# Patient Record
Sex: Male | Born: 1990 | Race: White | Hispanic: No | Marital: Single | Smoking: Never smoker
Health system: Southern US, Community
[De-identification: ages and names within clinical notes are randomized; demographics above are authoritative.]

## PROBLEM LIST (undated history)

## (undated) DIAGNOSIS — S86812A Strain of other muscle(s) and tendon(s) at lower leg level, left leg, initial encounter: Secondary | ICD-10-CM

## (undated) DIAGNOSIS — Z789 Other specified health status: Secondary | ICD-10-CM

## (undated) HISTORY — PX: NO PAST SURGERIES: SHX2092

---
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
## 2011-01-04 ENCOUNTER — Inpatient Hospital Stay (INDEPENDENT_AMBULATORY_CARE_PROVIDER_SITE_OTHER)
Admission: RE | Admit: 2011-01-04 | Discharge: 2011-01-04 | Disposition: A | Discharge status: Home / Self Care | Payer: BC Managed Care – PPO | Source: Ambulatory Visit | Attending: Emergency Medicine | Admitting: Emergency Medicine

## 2011-01-04 DIAGNOSIS — Z888 Allergy status to other drugs, medicaments and biological substances status: Secondary | ICD-10-CM

## 2011-01-05 ENCOUNTER — Emergency Department (HOSPITAL_BASED_OUTPATIENT_CLINIC_OR_DEPARTMENT_OTHER)
Admission: EM | Admit: 2011-01-05 | Discharge: 2011-01-05 | Disposition: A | Discharge status: Home / Self Care | Payer: BC Managed Care – PPO | Attending: Emergency Medicine | Admitting: Emergency Medicine

## 2011-01-05 DIAGNOSIS — R21 Rash and other nonspecific skin eruption: Secondary | ICD-10-CM | POA: Insufficient documentation
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
## 2011-04-16 LAB — CBC
Hemoglobin: 14
MCV: 88.6
RDW: 11.9
WBC: 11.2

## 2011-04-16 LAB — URINALYSIS, ROUTINE W REFLEX MICROSCOPIC
Bilirubin Urine: NEGATIVE
Glucose, UA: NEGATIVE
Ketones, ur: NEGATIVE
Nitrite: NEGATIVE
Protein, ur: NEGATIVE
Urobilinogen, UA: 0.2

## 2011-04-16 LAB — DIFFERENTIAL
Basophils Absolute: 0.1
Basophils Relative: 1
Eosinophils Absolute: 0.2
Lymphocytes Relative: 26
Monocytes Absolute: 1.2
Monocytes Relative: 11

## 2011-04-16 LAB — COMPREHENSIVE METABOLIC PANEL
ALT: 35
Albumin: 4.9
Chloride: 100
Creatinine, Ser: 1.1
Sodium: 142
Total Bilirubin: 0.3

## 2011-09-27 ENCOUNTER — Emergency Department (INDEPENDENT_AMBULATORY_CARE_PROVIDER_SITE_OTHER): Payer: BC Managed Care – PPO

## 2011-09-27 ENCOUNTER — Emergency Department (INDEPENDENT_AMBULATORY_CARE_PROVIDER_SITE_OTHER)
Admission: EM | Admit: 2011-09-27 | Discharge: 2011-09-27 | Disposition: A | Discharge status: Home Health | Payer: BC Managed Care – PPO | Source: Home / Self Care

## 2011-09-27 ENCOUNTER — Encounter (HOSPITAL_COMMUNITY): Payer: Self-pay | Admitting: Emergency Medicine

## 2011-09-27 DIAGNOSIS — S52599A Other fractures of lower end of unspecified radius, initial encounter for closed fracture: Secondary | ICD-10-CM

## 2011-09-27 DIAGNOSIS — W19XXXA Unspecified fall, initial encounter: Secondary | ICD-10-CM

## 2011-09-27 DIAGNOSIS — S62009A Unspecified fracture of navicular [scaphoid] bone of unspecified wrist, initial encounter for closed fracture: Secondary | ICD-10-CM

## 2011-09-27 DIAGNOSIS — S52515A Nondisplaced fracture of left radial styloid process, initial encounter for closed fracture: Secondary | ICD-10-CM

## 2011-09-27 MED ORDER — IBUPROFEN 800 MG PO TABS
800.0000 mg | ORAL_TABLET | Freq: Once | ORAL | Status: AC
  Administered 2011-09-27: 800 mg via ORAL

## 2011-09-27 MED ORDER — HYDROCODONE-ACETAMINOPHEN 5-325 MG PO TABS: 1.0000 | ORAL_TABLET | Freq: Four times a day (QID) | ORAL | Status: AC | PRN

## 2011-09-27 MED ORDER — IBUPROFEN 800 MG PO TABS
ORAL_TABLET | ORAL | Status: AC
  Filled 2011-09-27: qty 1

## 2011-09-27 NOTE — Discharge Instructions (Signed)
Elevate your wrist as much as possible, and apply ice packs 3-4 times for 15-20 minutes. Wear splint until you see Dr Lestine Box for follow up.  Do not drive or operate machinery while taking Hydrocodone for pain.

## 2011-09-27 NOTE — ED Notes (Signed)
PT HERE WITH POSS LEFT WRIST FX AFTER PLAYING BASKETBALL YESTERDAY AND FELL ON  WRIST AFTER TRYING TO BLOCK PASS.DECREASE ROM AND SWELLING SEEN BUT DENIES NUMB/TINGLING.

## 2011-09-27 NOTE — ED Provider Notes (Signed)
History     CSN: [id number]  Arrival date & time 09/27/11  0841   None     Chief Complaint  Patient presents with  . Wrist Pain    (Consider location/radiation/quality/duration/timing/severity/associated sxs/prior treatment) HPI Comments: Patient presents today with complaints of left wrist pain. He fell yesterday while playing basketball landing on an outstretched left hand. He points to the distal radial head and scaphoid area as his area of tenderness. He denies any numbness or tingling.   History reviewed. No pertinent past medical history.  History reviewed. No pertinent past surgical history.  History reviewed. No pertinent family history.  History  Substance Use Topics  . Smoking status: Never Smoker   . Smokeless tobacco: Not on file  . Alcohol Use: No      Review of Systems  Musculoskeletal: Negative for joint swelling.  Skin: Negative for color change and wound.  Neurological: Negative for numbness.    Allergies  Review of patient's allergies indicates no known allergies.  Home Medications   Current Outpatient Rx  Name Route Sig Dispense Refill  . HYDROCODONE-ACETAMINOPHEN 5-325 MG PO TABS Oral Take 1 tablet by mouth every 6 (six) hours as needed for pain. 10 tablet 0    BP 131/79  Pulse 88  Temp(Src) 97.2 F (36.2 C) (Oral)  Resp 16  SpO2 99%  Physical Exam  Nursing note and vitals reviewed. Constitutional: He appears well-developed and well-nourished. No distress.  Cardiovascular:  Pulses:      Radial pulses are 2+ on the left side.       Cap refill < 2 sec Lt hand  Musculoskeletal:       Left wrist: He exhibits bony tenderness (distal radial head and scaphoid area) and swelling (mild). He exhibits normal range of motion, no deformity and no laceration.       Left hand: Normal. normal sensation noted. Normal strength noted.  Neurological: He is alert. He has normal strength. No sensory deficit.  Skin: Skin is warm and dry. No abrasion and  no bruising noted.  Psychiatric: He has a normal mood and affect.    ED Course  Procedures (including critical care time)  Labs Reviewed - No data to display Dg Wrist Complete Left  09/27/2011  *RADIOLOGY REPORT*  Clinical Data: Left wrist pain after fall.  LEFT WRIST - COMPLETE 3+ VIEW  Comparison: None.  Findings: Four views study of the left wrist shows a linear lucency through the articular surface of the distal radius.  This raises the question of a nondisplaced radial styloid fracture.  On the scaphoid view, there is a linear lucency through the proximal scaphoid.  Although this is slightly more proximal than typically seen for a scaphoid waist fracture, a nondisplaced scaphoid fracture is a consideration.  IMPRESSION: Two findings in the left wrist which raise concern for nondisplaced fractures.  Either CT without contrast or MRI without contrast could be used to more fully characterize these findings.  Original Report Authenticated By: ERIC A. MANSELL, M.D.     1. Scaphoid fracture of wrist   2. Nondisplaced fracture of styloid process of left radius       MDM  Xray reviewed by myself and radiologist. Discussed importance of Ortho f/u for scaphoid fracture with pt, and risk of poor healing scaphoid fractures.         Melody Comas, [place] 09/27/11 1148

## 2011-09-29 NOTE — ED Notes (Signed)
Pt. came to [facility] because he lost his work note. Note redone like original and given to pt. Pt. states he called Dr. Lestine Box office and they gave him another doctors office to follow-up but can't remember his name.  He has an appt. next Thur. 10/07/11.  I called Dr. Lestine Box office and they said Dr. Mina Marble was on call for hands on 3/11 and that is why they gave him that office. I asked Dr. Juanetta Gosling and he said it was OK for pt. to wait the 10 days for recheck of a scaphoid fx. and can work with splint doing light duty. Office notified he would stay with appt. With Dr. Mina Marble. Pt. Notified also and given Dr. Ronie Spies address and phone number. Vassie Moselle 09/29/2011

## 2015-04-10 ENCOUNTER — Encounter (HOSPITAL_COMMUNITY): Payer: Self-pay | Admitting: Emergency Medicine

## 2015-04-10 ENCOUNTER — Emergency Department (INDEPENDENT_AMBULATORY_CARE_PROVIDER_SITE_OTHER)
Admission: EM | Admit: 2015-04-10 | Discharge: 2015-04-10 | Disposition: A | Discharge status: Home / Self Care | Payer: BLUE CROSS/BLUE SHIELD | Source: Home / Self Care | Attending: Family Medicine | Admitting: Family Medicine

## 2015-04-10 DIAGNOSIS — Z041 Encounter for examination and observation following transport accident: Secondary | ICD-10-CM

## 2015-04-10 DIAGNOSIS — S161XXA Strain of muscle, fascia and tendon at neck level, initial encounter: Secondary | ICD-10-CM

## 2015-04-10 DIAGNOSIS — Z043 Encounter for examination and observation following other accident: Secondary | ICD-10-CM | POA: Diagnosis not present

## 2015-04-10 NOTE — Discharge Instructions (Signed)
Cervical Strain and Sprain (Whiplash) °with Rehab °Cervical strain and sprain are injuries that commonly occur with "whiplash" injuries. Whiplash occurs when the neck is forcefully whipped backward or forward, such as during a motor vehicle accident or during contact sports. The muscles, ligaments, tendons, discs, and nerves of the neck are susceptible to injury when this occurs. °RISK FACTORS °Risk of having a whiplash injury increases if: °· Osteoarthritis of the spine. °· Situations that make head or neck accidents or trauma more likely. °· High-risk sports (football, rugby, wrestling, hockey, auto racing, gymnastics, diving, contact karate, or boxing). °· Poor strength and flexibility of the neck. °· Previous neck injury. °· Poor tackling technique. °· Improperly fitted or padded equipment. °SYMPTOMS  °· Pain or stiffness in the front or back of neck or both. °· Symptoms may present immediately or up to 24 hours after injury. °· Dizziness, headache, nausea, and vomiting. °· Muscle spasm with soreness and stiffness in the neck. °· Tenderness and swelling at the injury site. °PREVENTION °· Learn and use proper technique (avoid tackling with the head, spearing, and head-butting; use proper falling techniques to avoid landing on the head). °· Warm up and stretch properly before activity. °· Maintain physical fitness: °· Strength, flexibility, and endurance. °· Cardiovascular fitness. °· Wear properly fitted and padded protective equipment, such as padded soft collars, for participation in contact sports. °PROGNOSIS  °Recovery from cervical strain and sprain injuries is dependent on the extent of the injury. These injuries are usually curable in 1 week to 3 months with appropriate treatment.  °RELATED COMPLICATIONS  °· Temporary numbness and weakness may occur if the nerve roots are damaged, and this may persist until the nerve has completely healed. °· Chronic pain due to frequent recurrence of  symptoms. °· Prolonged healing, especially if activity is resumed too soon (before complete recovery). °TREATMENT  °Treatment initially involves the use of ice and medication to help reduce pain and inflammation. It is also important to perform strengthening and stretching exercises and modify activities that worsen symptoms so the injury does not get worse. These exercises may be performed at home or with a therapist. For patients who experience severe symptoms, a soft, padded collar may be recommended to be worn around the neck.  °Improving your posture may help reduce symptoms. Posture improvement includes pulling your chin and abdomen in while sitting or standing. If you are sitting, sit in a firm chair with your buttocks against the back of the chair. While sleeping, try replacing your pillow with a small towel rolled to 2 inches in diameter, or use a cervical pillow or soft cervical collar. Poor sleeping positions delay healing.  °For patients with nerve root damage, which causes numbness or weakness, the use of a cervical traction apparatus may be recommended. Surgery is rarely necessary for these injuries. However, cervical strain and sprains that are present at birth (congenital) may require surgery. °MEDICATION  °· If pain medication is necessary, nonsteroidal anti-inflammatory medications, such as aspirin and ibuprofen, or other minor pain relievers, such as acetaminophen, are often recommended. °· Do not take pain medication for 7 days before surgery. °· Prescription pain relievers may be given if deemed necessary by your caregiver. Use only as directed and only as much as you need. °HEAT AND COLD:  °· Cold treatment (icing) relieves pain and reduces inflammation. Cold treatment should be applied for 10 to 15 minutes every 2 to 3 hours for inflammation and pain and immediately after any activity that aggravates   your symptoms. Use ice packs or an ice massage. °· Heat treatment may be used prior to  performing the stretching and strengthening activities prescribed by your caregiver, physical therapist, or athletic trainer. Use a heat pack or a warm soak. °SEEK MEDICAL CARE IF:  °· Symptoms get worse or do not improve in 2 weeks despite treatment. °· New, unexplained symptoms develop (drugs used in treatment may produce side effects). °EXERCISES °RANGE OF MOTION (ROM) AND STRETCHING EXERCISES - Cervical Strain and Sprain °These exercises may help you when beginning to rehabilitate your injury. In order to successfully resolve your symptoms, you must improve your posture. These exercises are designed to help reduce the forward-head and rounded-shoulder posture which contributes to this condition. Your symptoms may resolve with or without further involvement from your physician, physical therapist or athletic trainer. While completing these exercises, remember:  °· Restoring tissue flexibility helps normal motion to return to the joints. This allows healthier, less painful movement and activity. °· An effective stretch should be held for at least 20 seconds, although you may need to begin with shorter hold times for comfort. °· A stretch should never be painful. You should only feel a gentle lengthening or release in the stretched tissue. °STRETCH- Axial Extensors °· Lie on your back on the floor. You may bend your knees for comfort. Place a rolled-up hand towel or dish towel, about 2 inches in diameter, under the part of your head that makes contact with the floor. °· Gently tuck your chin, as if trying to make a "double chin," until you feel a gentle stretch at the base of your head. °· Hold __________ seconds. °Repeat __________ times. Complete this exercise __________ times per day.  °STRETCH - Axial Extension  °· Stand or sit on a firm surface. Assume a good posture: chest up, shoulders drawn back, abdominal muscles slightly tense, knees unlocked (if standing) and feet hip width apart. °· Slowly retract your  chin so your head slides back and your chin slightly lowers. Continue to look straight ahead. °· You should feel a gentle stretch in the back of your head. Be certain not to feel an aggressive stretch since this can cause headaches later. °· Hold for __________ seconds. °Repeat __________ times. Complete this exercise __________ times per day. °STRETCH - Cervical Side Bend  °· Stand or sit on a firm surface. Assume a good posture: chest up, shoulders drawn back, abdominal muscles slightly tense, knees unlocked (if standing) and feet hip width apart. °· Without letting your nose or shoulders move, slowly tip your right / left ear to your shoulder until your feel a gentle stretch in the muscles on the opposite side of your neck. °· Hold __________ seconds. °Repeat __________ times. Complete this exercise __________ times per day. °STRETCH - Cervical Rotators  °· Stand or sit on a firm surface. Assume a good posture: chest up, shoulders drawn back, abdominal muscles slightly tense, knees unlocked (if standing) and feet hip width apart. °· Keeping your eyes level with the ground, slowly turn your head until you feel a gentle stretch along the back and opposite side of your neck. °· Hold __________ seconds. °Repeat __________ times. Complete this exercise __________ times per day. °RANGE OF MOTION - Neck Circles  °· Stand or sit on a firm surface. Assume a good posture: chest up, shoulders drawn back, abdominal muscles slightly tense, knees unlocked (if standing) and feet hip width apart. °· Gently roll your head down and around from the   back of one shoulder to the back of the other. The motion should never be forced or painful.  Repeat the motion 10-20 times, or until you feel the neck muscles relax and loosen. Repeat __________ times. Complete the exercise __________ times per day. STRENGTHENING EXERCISES - Cervical Strain and Sprain These exercises may help you when beginning to rehabilitate your injury. They may  resolve your symptoms with or without further involvement from your physician, physical therapist, or athletic trainer. While completing these exercises, remember:   Muscles can gain both the endurance and the strength needed for everyday activities through controlled exercises.  Complete these exercises as instructed by your physician, physical therapist, or athletic trainer. Progress the resistance and repetitions only as guided.  You may experience muscle soreness or fatigue, but the pain or discomfort you are trying to eliminate should never worsen during these exercises. If this pain does worsen, stop and make certain you are following the directions exactly. If the pain is still present after adjustments, discontinue the exercise until you can discuss the trouble with your clinician. STRENGTH - Cervical Flexors, Isometric  Face a wall, standing about 6 inches away. Place a small pillow, a ball about 6-8 inches in diameter, or a folded towel between your forehead and the wall.  Slightly tuck your chin and gently push your forehead into the soft object. Push only with mild to moderate intensity, building up tension gradually. Keep your jaw and forehead relaxed.  Hold 10 to 20 seconds. Keep your breathing relaxed.  Release the tension slowly. Relax your neck muscles completely before you start the next repetition. Repeat __________ times. Complete this exercise __________ times per day. STRENGTH- Cervical Lateral Flexors, Isometric   Stand about 6 inches away from a wall. Place a small pillow, a ball about 6-8 inches in diameter, or a folded towel between the side of your head and the wall.  Slightly tuck your chin and gently tilt your head into the soft object. Push only with mild to moderate intensity, building up tension gradually. Keep your jaw and forehead relaxed.  Hold 10 to 20 seconds. Keep your breathing relaxed.  Release the tension slowly. Relax your neck muscles completely  before you start the next repetition. Repeat __________ times. Complete this exercise __________ times per day. STRENGTH - Cervical Extensors, Isometric   Stand about 6 inches away from a wall. Place a small pillow, a ball about 6-8 inches in diameter, or a folded towel between the back of your head and the wall.  Slightly tuck your chin and gently tilt your head back into the soft object. Push only with mild to moderate intensity, building up tension gradually. Keep your jaw and forehead relaxed.  Hold 10 to 20 seconds. Keep your breathing relaxed.  Release the tension slowly. Relax your neck muscles completely before you start the next repetition. Repeat __________ times. Complete this exercise __________ times per day. POSTURE AND BODY MECHANICS CONSIDERATIONS - Cervical Strain and Sprain Keeping correct posture when sitting, standing or completing your activities will reduce the stress put on different body tissues, allowing injured tissues a chance to heal and limiting painful experiences. The following are general guidelines for improved posture. Your physician or physical therapist will provide you with any instructions specific to your needs. While reading these guidelines, remember:  The exercises prescribed by your provider will help you have the flexibility and strength to maintain correct postures.  The correct posture provides the optimal environment for your joints to  work. All of your joints have less wear and tear when properly supported by a spine with good posture. This means you will experience a healthier, less painful body.  Correct posture must be practiced with all of your activities, especially prolonged sitting and standing. Correct posture is as important when doing repetitive low-stress activities (typing) as it is when doing a single heavy-load activity (lifting). PROLONGED STANDING WHILE SLIGHTLY LEANING FORWARD When completing a task that requires you to lean  forward while standing in one place for a long time, place either foot up on a stationary 2- to 4-inch high object to help maintain the best posture. When both feet are on the ground, the low back tends to lose its slight inward curve. If this curve flattens (or becomes too large), then the back and your other joints will experience too much stress, fatigue more quickly, and can cause pain.  RESTING POSITIONS Consider which positions are most painful for you when choosing a resting position. If you have pain with flexion-based activities (sitting, bending, stooping, squatting), choose a position that allows you to rest in a less flexed posture. You would want to avoid curling into a fetal position on your side. If your pain worsens with extension-based activities (prolonged standing, working overhead), avoid resting in an extended position such as sleeping on your stomach. Most people will find more comfort when they rest with their spine in a more neutral position, neither too rounded nor too arched. Lying on a non-sagging bed on your side with a pillow between your knees, or on your back with a pillow under your knees will often provide some relief. Keep in mind, being in any one position for a prolonged period of time, no matter how correct your posture, can still lead to stiffness. WALKING Walk with an upright posture. Your ears, shoulders, and hips should all line up. OFFICE WORK When working at a desk, create an environment that supports good, upright posture. Without extra support, muscles fatigue and lead to excessive strain on joints and other tissues. CHAIR:  A chair should be able to slide under your desk when your back makes contact with the back of the chair. This allows you to work closely.  The chair's height should allow your eyes to be level with the upper part of your monitor and your hands to be slightly lower than your elbows.  Body position:  Your feet should make contact with the  floor. If this is not possible, use a foot rest.  Keep your ears over your shoulders. This will reduce stress on your neck and low back. Document Released: 07/05/2005 Document Revised: 11/19/2013 Document Reviewed: 10/17/2008 [facility] Patient Information 2015 [place], [place]. This information is not intended to replace advice given to you by your health care provider. Make sure you discuss any questions you have with your health care provider.  Muscle Strain A muscle strain is an injury that occurs when a muscle is stretched beyond its normal length. Usually a small number of muscle fibers are torn when this happens. Muscle strain is rated in degrees. First-degree strains have the least amount of muscle fiber tearing and pain. Second-degree and third-degree strains have increasingly more tearing and pain.  Usually, recovery from muscle strain takes 1-2 weeks. Complete healing takes 5-6 weeks.  CAUSES  Muscle strain happens when a sudden, violent force placed on a muscle stretches it too far. This may occur with lifting, sports, or a fall.  RISK FACTORS Muscle strain is  especially common in athletes.  SIGNS AND SYMPTOMS At the site of the muscle strain, there may be:  Pain.  Bruising.  Swelling.  Difficulty using the muscle due to pain or lack of normal function. DIAGNOSIS  Your health care provider will perform a physical exam and ask about your medical history. TREATMENT  Often, the best treatment for a muscle strain is resting, icing, and applying cold compresses to the injured area.  HOME CARE INSTRUCTIONS   Use the PRICE method of treatment to promote muscle healing during the first 2-3 days after your injury. The PRICE method involves:  Protecting the muscle from being injured again.  Restricting your activity and resting the injured body part.  Icing your injury. To do this, put ice in a plastic bag. Place a towel between your skin and the bag. Then, apply the ice and leave  it on from 15-20 minutes each hour. After the third day, switch to moist heat packs.  Apply compression to the injured area with a splint or elastic bandage. Be careful not to wrap it too tightly. This may interfere with blood circulation or increase swelling.  Elevate the injured body part above the level of your heart as often as you can.  Only take over-the-counter or prescription medicines for pain, discomfort, or fever as directed by your health care provider.  Warming up prior to exercise helps to prevent future muscle strains. SEEK MEDICAL CARE IF:   You have increasing pain or swelling in the injured area.  You have numbness, tingling, or a significant loss of strength in the injured area. MAKE SURE YOU:   Understand these instructions.  Will watch your condition.  Will get help right away if you are not doing well or get worse. Document Released: 07/05/2005 Document Revised: 04/25/2013 Document Reviewed: 02/01/2013 [facility] Patient Information 2015 [place], [place]. This information is not intended to replace advice given to you by your health care provider. Make sure you discuss any questions you have with your health care provider.
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
## 2015-09-05 ENCOUNTER — Ambulatory Visit (INDEPENDENT_AMBULATORY_CARE_PROVIDER_SITE_OTHER): Payer: BLUE CROSS/BLUE SHIELD | Admitting: Family Medicine

## 2015-09-05 ENCOUNTER — Encounter: Payer: Self-pay | Admitting: Family Medicine

## 2015-09-05 VITALS — BP 116/76 | HR 64 | Ht 73.5 in | Wt 216.0 lb

## 2015-09-05 DIAGNOSIS — Z0000 Encounter for general adult medical examination without abnormal findings: Secondary | ICD-10-CM

## 2015-09-05 DIAGNOSIS — R05 Cough: Secondary | ICD-10-CM

## 2015-09-05 DIAGNOSIS — M545 Low back pain, unspecified: Secondary | ICD-10-CM

## 2015-09-05 DIAGNOSIS — R059 Cough, unspecified: Secondary | ICD-10-CM

## 2015-09-05 LAB — POCT URINALYSIS DIPSTICK
BILIRUBIN UA: NEGATIVE
Blood, UA: NEGATIVE
Glucose, UA: NEGATIVE
KETONES UA: NEGATIVE
LEUKOCYTES UA: NEGATIVE
Nitrite, UA: NEGATIVE
PH UA: 7
Protein, UA: NEGATIVE
Spec Grav, UA: 1.025
Urobilinogen, UA: NEGATIVE

## 2015-09-05 NOTE — Patient Instructions (Addendum)
As we discussed, since your cough and symptoms are continuing to improve, I recommend over-the-counter Robitussin-DM or Mucinex DM as needed and staying well hydrated. You can use cough lozenges also.  If your cough gets worse again call and we can prescribe cough medication or other therapies.  I recommend that you try to get any previous records and immunizations sent to our office.  Return to my office fasting (nothing to eat or drink for 6 hours) at your convenience for fasting blood work.   As we discussed, for your low back discomfort, use heat to the area 20 minutes at a time 2-3 times per day and try stretching. You may also take ibuprofen 800 mg or Aleve 2 tablets twice daily with food. If this gets worse, let me know.  Preventative Care for Adults, Male       REGULAR HEALTH EXAMS:  A routine yearly physical is a good way to check in with your primary care provider about your health and preventive screening. It is also an opportunity to share updates about your health and any concerns you have, and receive a thorough all-over exam.   Most health insurance companies pay for at least some preventative services.  Check with your health plan for specific coverages.  WHAT PREVENTATIVE SERVICES DO MEN NEED?  Adult men should have their weight and blood pressure checked regularly.   Men age 62 and older should have their cholesterol levels checked regularly.  Beginning at age 78 and continuing to age 64, men should be screened for colorectal cancer.  Certain people should may need continued testing until age 44.  Other cancer screening may include exams for testicular and prostate cancer.  Updating vaccinations is part of preventative care.  Vaccinations help protect against diseases such as the flu.  Lab tests are generally done as part of preventative care to screen for anemia and blood disorders, to screen for problems with the kidneys and liver, to screen for bladder problems, to  check blood sugar, and to check your cholesterol level.  Preventative services generally include counseling about diet, exercise, avoiding tobacco, drugs, excessive alcohol consumption, and sexually transmitted infections.    GENERAL RECOMMENDATIONS FOR GOOD HEALTH:  Healthy diet:  Eat a variety of foods, including fruit, vegetables, animal or vegetable protein, such as meat, fish, chicken, and eggs, or beans, lentils, tofu, and grains, such as rice.  Drink plenty of water daily.  Decrease saturated fat in the diet, avoid lots of red meat, processed foods, sweets, fast foods, and fried foods.  Exercise:  Aerobic exercise helps maintain good heart health. At least 30-40 minutes of moderate-intensity exercise is recommended. For example, a brisk walk that increases your heart rate and breathing. This should be done on most days of the week.   Find a type of exercise or a variety of exercises that you enjoy so that it becomes a part of your daily life.  Examples are running, walking, swimming, water aerobics, and biking.  For motivation and support, explore group exercise such as aerobic class, spin class, Zumba, Yoga,or  martial arts, etc.    Set exercise goals for yourself, such as a certain weight goal, walk or run in a race such as a 5k walk/run.  Speak to your primary care provider about exercise goals.  Disease prevention:  If you smoke or chew tobacco, find out from your caregiver how to quit. It can literally save your life, no matter how long you have been a tobacco  user. If you do not use tobacco, never begin.   Maintain a healthy diet and normal weight. Increased weight leads to problems with blood pressure and diabetes.   The Body Mass Index or BMI is a way of measuring how much of your body is fat. Having a BMI above 27 increases the risk of heart disease, diabetes, hypertension, stroke and other problems related to obesity. Your caregiver can help determine your BMI and based  on it develop an exercise and dietary program to help you achieve or maintain this important measurement at a healthful level.  High blood pressure causes heart and blood vessel problems.  Persistent high blood pressure should be treated with medicine if weight loss and exercise do not work.   Fat and cholesterol leaves deposits in your arteries that can block them. This causes heart disease and vessel disease elsewhere in your body.  If your cholesterol is found to be high, or if you have heart disease or certain other medical conditions, then you may need to have your cholesterol monitored frequently and be treated with medication.   Ask if you should have a stress test if your history suggests this. A stress test is a test done on a treadmill that looks for heart disease. This test can find disease prior to there being a problem.  Avoid drinking alcohol in excess (more than two drinks per day).  Avoid use of street drugs. Do not share needles with anyone. Ask for professional help if you need assistance or instructions on stopping the use of alcohol, cigarettes, and/or drugs.  Brush your teeth twice a day with fluoride toothpaste, and floss once a day. Good oral hygiene prevents tooth decay and gum disease. The problems can be painful, unattractive, and can cause other health problems. Visit your dentist for a routine oral and dental check up and preventive care every 6-12 months.   Look at your skin regularly.  Use a mirror to look at your back. Notify your caregivers of changes in moles, especially if there are changes in shapes, colors, a size larger than a pencil eraser, an irregular border, or development of new moles.  Safety:  Use seatbelts 100% of the time, whether driving or as a passenger.  Use safety devices such as hearing protection if you work in environments with loud noise or significant background noise.  Use safety glasses when doing any work that could send debris in to the  eyes.  Use a helmet if you ride a bike or motorcycle.  Use appropriate safety gear for contact sports.  Talk to your caregiver about gun safety.  Use sunscreen with a SPF (or skin protection factor) of 15 or greater.  Lighter skinned people are at a greater risk of skin cancer. Don't forget to also wear sunglasses in order to protect your eyes from too much damaging sunlight. Damaging sunlight can accelerate cataract formation.   Practice safe sex. Use condoms. Condoms are used for birth control and to help reduce the spread of sexually transmitted infections (or STIs).  Some of the STIs are gonorrhea (the clap), chlamydia, syphilis, trichomonas, herpes, HPV (human papilloma virus) and HIV (human immunodeficiency virus) which causes AIDS. The herpes, HIV and HPV are viral illnesses that have no cure. These can result in disability, cancer and death.   Keep carbon monoxide and smoke detectors in your home functioning at all times. Change the batteries every 6 months or use a model that plugs into the wall.  Vaccinations:  Stay up to date with your tetanus shots and other required immunizations. You should have a booster for tetanus every 10 years. Be sure to get your flu shot every year, since 5%-20% of the [place] population comes down with the flu. The flu vaccine changes each year, so being vaccinated once is not enough. Get your shot in the fall, before the flu season peaks.   Other vaccines to consider:  Pneumococcal vaccine to protect against certain types of pneumonia.  This is normally recommended for adults age 61 or older.  However, adults younger than 25 years old with certain underlying conditions such as diabetes, heart or lung disease should also receive the vaccine.  Shingles vaccine to protect against Varicella Zoster if you are older than age 49, or younger than 25 years old with certain underlying illness.  Hepatitis A vaccine to protect against a form of infection of the liver by a  virus acquired from food.  Hepatitis B vaccine to protect against a form of infection of the liver by a virus acquired from blood or body fluids, particularly if you work in health care.  If you plan to travel internationally, check with your local health department for specific vaccination recommendations.  Cancer Screening:  Most routine colon cancer screening begins at the age of 29. On a yearly basis, doctors may provide special easy to use take-home tests to check for hidden blood in the stool. Sigmoidoscopy or colonoscopy can detect the earliest forms of colon cancer and is life saving. These tests use a small camera at the end of a tube to directly examine the colon. Speak to your caregiver about this at age 67, when routine screening begins (and is repeated every 5 years unless early forms of pre-cancerous polyps or small growths are found).   At the age of 38 men usually start screening for prostate cancer every year. Screening may begin at a younger age for those with higher risk. Those at higher risk include African-Americans or having a family history of prostate cancer. There are two types of tests for prostate cancer:   Prostate-specific antigen (PSA) testing. Recent studies raise questions about prostate cancer using PSA and you should discuss this with your caregiver.   Digital rectal exam (in which your doctor's lubricated and gloved finger feels for enlargement of the prostate through the anus).   Screening for testicular cancer.  Do a monthly exam of your testicles. Gently roll each testicle between your thumb and fingers, feeling for any abnormal lumps. The best time to do this is after a hot shower or bath when the tissues are looser. Notify your caregivers of any lumps, tenderness or changes in size or shape immediately.

## 2015-09-05 NOTE — Progress Notes (Signed)
Subjective:    Patient ID: William Wall, male    DOB: 1990-09-17, 25 y.o.   MRN: [id number]  HPI Chief Complaint  Patient presents with  . new    new pt cpe. cough for 2 weeks. no other concerns   He is new to the practice and here to establish primary care. He is also here for complete physical exam. He also has an acute complaint of cough for past 2 weeks. Last week he states he had fever, chills, bodyaches but that these symptoms have resolved. States cough is lingering. Reports having had bronchitis a few years ago. Denies history of pneumonia, asthma or lung disease. Has not taken anything for cough. Denies sore throat, earache, congestion, drainage.  He states he has not been able to go to the gym to do his usual workouts due to feeling bad. He has noticed some mild right low back pain that feels like a dull ache and is made worse with movement after sitting for a while or lying in bed. Denies fever, chills, urinary symptoms, weakness, numbness or tingling.    Past medical history: denies Surgeries: denies Family history: MGM with Diabetes.   Denies smoking, drug use, drinks alcohol socially.  Has a girlfriend. 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 Moved here from [place] in 2007.   Last eye exam: unknown.  Dentist: overdue.  Flu shot Tdap: need immunization records.   Wears seatbelt, wears sunscreen.   Review of Systems Review of Systems Constitutional: -fever, -chills, -sweats, -unexpected weight change,-fatigue ENT: -runny nose, -ear pain, -sore throat Cardiology:  -chest pain, -palpitations, -edema Respiratory: -cough, -shortness of breath, -wheezing Gastroenterology: -abdominal pain, -nausea, -vomiting, -diarrhea, -constipation  Hematology: -bleeding or bruising problems Musculoskeletal: -arthralgias, -myalgias, -joint swelling, -back pain Ophthalmology: -vision changes Urology: -dysuria, -difficulty urinating,  -hematuria, -urinary frequency, -urgency Neurology: -headache, -weakness, -tingling, -numbness       Objective:   Physical Exam BP 116/76 mmHg  Pulse 64  Ht 6' 1.5" (1.867 m)  Wt 216 lb (97.977 kg)  BMI 28.11 kg/m2  General Appearance:    Alert, cooperative, no distress, appears stated age  Head:    Normocephalic, without obvious abnormality, atraumatic  Eyes:    PERRL, conjunctiva/corneas clear, EOM's intact, fundi    benign  Ears:    Normal TM's and external ear canals  Nose:   Nares normal, mucosa normal, no drainage or sinus   tenderness  Throat:   Lips, mucosa, and tongue normal; teeth and gums normal  Neck:   Supple, no lymphadenopathy;  thyroid:  no   enlargement/tenderness/nodules; no carotid   bruit or JVD  Back:    Spine nontender, no curvature, ROM normal, no CVA     Tenderness, no rash or asymmetry, no spasm.  Lungs:     Clear to auscultation bilaterally without wheezes, rales or     ronchi; respirations unlabored  Chest Wall:    No tenderness or deformity   Heart:    Regular rate and rhythm, S1 and S2 normal, no murmur, rub   or gallop  Breast Exam:    No chest wall tenderness, masses or gynecomastia  Abdomen:     Soft, non-tender, nondistended, normoactive bowel sounds,    no masses, no hepatosplenomegaly  Genitalia:    Discussed that this is recommended, patient refused.  Rectal:   Deferred due to age <40 and lack of symptoms  Extremities:   No clubbing,  cyanosis or edema  Pulses:   2+ and symmetric all extremities  Skin:   Skin color, texture, turgor normal, no rashes or lesions  Lymph nodes:   Cervical, supraclavicular, and axillary nodes normal  Neurologic:   CNII-XII intact, normal strength, sensation and gait; reflexes 2+ and symmetric throughout          Psych:   Normal mood, affect, hygiene and grooming.    Urinalysis dipstick: negative      Assessment & Plan:  Routine general medical examination at a health care facility - Plan: POCT urinalysis  dipstick, CBC with Differential/Platelet, Comprehensive metabolic panel, Lipid panel  Cough  Right-sided low back pain without sciatica  Discussed that his cough is most likely related to a viral etiology and since he reports feeling improved, i recommend continued symptomatic treatment. Recommend that if he gets worse, he will let me know and we may need to treat him with antibiotic therapy at that point. Discussed using mucinex DM or Robitussin DM for cough if needed, noted that the entire time I was in the room that he did not cough.  Suspect that his low back pain is related to a musculoskeletal etiology and recommend using heat 20 minutes at a time 2-3 times daily and stretching. He may also take Ibuprofen 800 mg 3 times daily or 2 Aleve twice daily with food for pain. His urine is negative. No signs of infection or systemic illness.  Discussed safety and health promotion. Recommend that he perform self testicular exams, he refused the clinical testicular exam today. Discussed that he is in the most common age group for testicular cancer.  Recommend he follow up in 1 year for annual exam or sooner if needed.  Refused flu shot. No immunization records- he will try to get these to me.  He is not fasting today- will return for fasting labs, orders in computer.  Recommend he schedule a dental exam and keep up with oral health.

## 2018-09-08 ENCOUNTER — Emergency Department (HOSPITAL_COMMUNITY): Payer: BLUE CROSS/BLUE SHIELD

## 2018-09-08 ENCOUNTER — Other Ambulatory Visit (HOSPITAL_COMMUNITY): Payer: Self-pay | Admitting: Orthopedic Surgery

## 2018-09-08 ENCOUNTER — Emergency Department (HOSPITAL_COMMUNITY)
Admission: EM | Admit: 2018-09-08 | Discharge: 2018-09-08 | Disposition: A | Discharge status: Home / Self Care | Payer: BLUE CROSS/BLUE SHIELD | Attending: Emergency Medicine | Admitting: Emergency Medicine

## 2018-09-08 ENCOUNTER — Ambulatory Visit (HOSPITAL_COMMUNITY): Payer: Self-pay | Admitting: Orthopedic Surgery

## 2018-09-08 DIAGNOSIS — Y9367 Activity, basketball: Secondary | ICD-10-CM | POA: Insufficient documentation

## 2018-09-08 DIAGNOSIS — X501XXA Overexertion from prolonged static or awkward postures, initial encounter: Secondary | ICD-10-CM | POA: Diagnosis not present

## 2018-09-08 DIAGNOSIS — Y999 Unspecified external cause status: Secondary | ICD-10-CM | POA: Insufficient documentation

## 2018-09-08 DIAGNOSIS — S86812A Strain of other muscle(s) and tendon(s) at lower leg level, left leg, initial encounter: Secondary | ICD-10-CM | POA: Insufficient documentation

## 2018-09-08 DIAGNOSIS — Y929 Unspecified place or not applicable: Secondary | ICD-10-CM | POA: Diagnosis not present

## 2018-09-08 MED ORDER — ONDANSETRON HCL 4 MG/2ML IJ SOLN
4.0000 mg | Freq: Once | INTRAMUSCULAR | Status: AC
  Administered 2018-09-08: 4 mg via INTRAVENOUS
  Filled 2018-09-08: qty 2

## 2018-09-08 MED ORDER — HYDROMORPHONE HCL 1 MG/ML IJ SOLN
1.0000 mg | Freq: Once | INTRAMUSCULAR | Status: AC
  Administered 2018-09-08: 1 mg via INTRAVENOUS
  Filled 2018-09-08: qty 1

## 2018-09-08 MED ORDER — ONDANSETRON 4 MG PO TBDP: 4.0000 mg | ORAL_TABLET | Freq: Three times a day (TID) | ORAL | 0 refills | Status: DC | PRN

## 2018-09-08 NOTE — ED Notes (Signed)
Ortho tech paged for crutches.  

## 2018-09-08 NOTE — ED Provider Notes (Signed)
[facility] EMERGENCY DEPARTMENT Provider Note   CSN: [id number] Arrival date & time: 09/08/18  1715    History   Chief Complaint No chief complaint on file.   HPI William Wall is a 28 y.o. male.     HPI  28 year old male presents with concern for left knee dislocation.  The patient was playing basketball and as he jumped he felt and heard a snap in his left knee.  He landed on his right leg.  This occurred about 30 minutes prior to arrival.  He is unable to extend his leg.  There is no weakness or numbness in his lower extremity.  He was given 100 mcg IV fentanyl by EMS.  No past medical history on file.  There are no active problems to display for this patient.   No past surgical history on file.      Home Medications    Prior to Admission medications   Medication Sig Start Date End Date Taking? Authorizing Provider  ondansetron (ZOFRAN ODT) 4 MG disintegrating tablet Take 1 tablet (4 mg total) by mouth every 8 (eight) hours as needed for nausea or vomiting. 09/08/18   Pricilla Loveless, MD    Family History No family history on file.  Social History Social History   Tobacco Use  . Smoking status: Never Smoker  Substance Use Topics  . Alcohol use: No  . Drug use: No     Allergies   Patient has no known allergies.   Review of Systems Review of Systems  Musculoskeletal: Positive for arthralgias.  Neurological: Negative for weakness and numbness.     Physical Exam Updated Vital Signs BP 135/76 (BP Location: Left Arm)   Pulse 92   Temp 98.7 F (37.1 C) (Oral)   Resp 18   SpO2 99%   Physical Exam Vitals signs and nursing note reviewed.  Constitutional:      Appearance: He is well-developed. He is not ill-appearing or diaphoretic.  HENT:     Head: Normocephalic and atraumatic.     Right Ear: External ear normal.     Left Ear: External ear normal.     Nose: Nose normal.  Eyes:     General:        Right eye: No discharge.          Left eye: No discharge.  Neck:     Musculoskeletal: Neck supple.  Cardiovascular:     Rate and Rhythm: Tachycardia present.     Pulses:          Dorsalis pedis pulses are 2+ on the left side.  Pulmonary:     Effort: Pulmonary effort is normal.  Abdominal:     General: There is no distension.     Palpations: Abdomen is soft.  Musculoskeletal:     Left knee: He exhibits decreased range of motion, swelling, deformity and abnormal patellar mobility.     Comments: Left foot is warm, well perfused, normal sensation and strong DP pulse Left knee is held flexed with high riding patella and deformity at patellar tendon.  No skin lacerations  Skin:    General: Skin is warm and dry.  Neurological:     Mental Status: He is alert.  Psychiatric:        Mood and Affect: Mood is not anxious.      ED Treatments / Results  Labs (all labs ordered are listed, but only abnormal results are displayed) Labs Reviewed - No data to display  EKG None  Radiology Dg Knee Complete 4 Views Left  Result Date: 09/08/2018 CLINICAL DATA:  Left knee injury playing basketball. Leg is stuck in bed position. EXAM: LEFT KNEE - COMPLETE 4+ VIEW COMPARISON:  No comparison studies available. FINDINGS: Four views study limited by positioning. No gross fracture of the distal femur or proximal tibia evident. No proximal fibular fracture noted. 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 No substantial joint effusion. No intact patellar tendon evident on lateral film. 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 Electronically Signed   By: Kennith Center M.D.   On: 09/08/2018 19:09    Procedures Procedures (including critical care time)  Medications Ordered in ED Medications  HYDROmorphone (DILAUDID) injection 1 mg (1 mg Intravenous Given 09/08/18 1736)  HYDROmorphone (DILAUDID) injection 1 mg (1 mg Intravenous Given 09/08/18 1842)  ondansetron  (ZOFRAN) injection 4 mg (4 mg Intravenous Given 09/08/18 1952)     Initial Impression / Assessment and Plan / ED Course  I have reviewed the triage vital signs and the nursing notes.  Pertinent labs & imaging results that were available during my care of the patient were reviewed by me and considered in my medical decision making (see chart for details).        Neurovascular intact.  Imaging confirms what is likely a patellar tendon rupture.  The patient was eventually able to get his leg straight and placed into a knee immobilizer.  After a couple doses of Dilaudid, he requires no more pain medicine is currently a little nauseated.  I discussed with Dr. Dion Saucier, who would like for patient to be discharged home and then return in the morning for surgery.  N.p.o. after midnight.  Patient declines prescription pain medicine but would like some nausea medicine at home and will take NSAIDs and Tylenol.  Final Clinical Impressions(s) / ED Diagnoses   Final diagnoses:  Rupture of left patellar tendon, initial encounter    ED Discharge Orders         Ordered    ondansetron (ZOFRAN ODT) 4 MG disintegrating tablet  Every 8 hours PRN     09/08/18 2011           Pricilla Loveless, MD 09/09/18 [phone]

## 2018-09-08 NOTE — Discharge Instructions (Addendum)
Do not eat or drink and have nothing by mouth after midnight tonight.  You will have surgery on your left knee tomorrow, 09/09/2018.  Arrive at Admitting for surgery at 10 AM.  Use the crutches provided and wear the knee immobilizer at all times.

## 2018-09-08 NOTE — Progress Notes (Signed)
Patient with closed patellar tendon rupture.    Plan for repair tomorrow in am.  Full H&P tomorrow, he will return to preop admissions in am at 10, surgery planned around 12.   Pain meds, crutches, knee immobilizer.  Eulas Post, MD

## 2018-09-08 NOTE — Progress Notes (Signed)
Orthopedic Tech Progress Note Patient Details:  William Wall May 23, 1991 [id number]  Ortho Devices Type of Ortho Device: Knee Immobilizer Ortho Device/Splint Location: lle Ortho Device/Splint Interventions: Ordered, Application, Adjustment   Post Interventions Patient Tolerated: Well Instructions Provided: Care of device, Adjustment of device   Trinna Post 09/08/2018, 7:13 PM

## 2018-09-08 NOTE — ED Notes (Signed)
Patient verbalizes understanding of discharge instructions. Opportunity for questioning and answers were provided. Armband removed by staff, pt discharged from ED.  

## 2018-09-08 NOTE — Progress Notes (Signed)
Orthopedic Tech Progress Note Patient Details:  William Wall 02/04/1991 [id number]  Ortho Devices Type of Ortho Device: Crutches Ortho Device/Splint Location: lle Ortho Device/Splint Interventions: Ordered, Application, Adjustment   Post Interventions Patient Tolerated: Well Instructions Provided: Adjustment of device, Care of device   William Wall 09/08/2018, 8:35 PM

## 2018-09-09 ENCOUNTER — Ambulatory Visit (HOSPITAL_COMMUNITY)
Admission: RE | Admit: 2018-09-09 | Discharge: 2018-09-09 | Disposition: A | Discharge status: Home / Self Care | Payer: BLUE CROSS/BLUE SHIELD | Attending: Orthopedic Surgery | Admitting: Orthopedic Surgery

## 2018-09-09 ENCOUNTER — Inpatient Hospital Stay (HOSPITAL_COMMUNITY): Payer: BLUE CROSS/BLUE SHIELD | Admitting: Anesthesiology

## 2018-09-09 ENCOUNTER — Encounter (HOSPITAL_COMMUNITY): Payer: Self-pay | Admitting: *Deleted

## 2018-09-09 ENCOUNTER — Encounter (HOSPITAL_COMMUNITY)
Admission: RE | Disposition: A | Discharge status: Home / Self Care | Payer: Self-pay | Source: Home / Self Care | Attending: Orthopedic Surgery

## 2018-09-09 DIAGNOSIS — S86812A Strain of other muscle(s) and tendon(s) at lower leg level, left leg, initial encounter: Secondary | ICD-10-CM | POA: Diagnosis present

## 2018-09-09 DIAGNOSIS — Z79899 Other long term (current) drug therapy: Secondary | ICD-10-CM | POA: Diagnosis not present

## 2018-09-09 DIAGNOSIS — X58XXXA Exposure to other specified factors, initial encounter: Secondary | ICD-10-CM | POA: Insufficient documentation

## 2018-09-09 DIAGNOSIS — S76112A Strain of left quadriceps muscle, fascia and tendon, initial encounter: Secondary | ICD-10-CM | POA: Diagnosis not present

## 2018-09-09 DIAGNOSIS — Y9367 Activity, basketball: Secondary | ICD-10-CM | POA: Insufficient documentation

## 2018-09-09 HISTORY — DX: Strain of other muscle(s) and tendon(s) at lower leg level, left leg, initial encounter: S86.812A

## 2018-09-09 HISTORY — DX: Other specified health status: Z78.9

## 2018-09-09 HISTORY — PX: PATELLAR TENDON REPAIR: SHX737

## 2018-09-09 SURGERY — REPAIR, TENDON, PATELLAR
Anesthesia: Regional | Site: Knee | Laterality: Left

## 2018-09-09 MED ORDER — FENTANYL CITRATE (PF) 250 MCG/5ML IJ SOLN
INTRAMUSCULAR | Status: DC | PRN
  Administered 2018-09-09: 50 ug via INTRAVENOUS
  Administered 2018-09-09 (×4): 25 ug via INTRAVENOUS

## 2018-09-09 MED ORDER — MIDAZOLAM HCL 2 MG/2ML IJ SOLN
INTRAMUSCULAR | Status: AC
  Administered 2018-09-09: 2 mg via INTRAVENOUS
  Filled 2018-09-09: qty 2

## 2018-09-09 MED ORDER — FENTANYL CITRATE (PF) 250 MCG/5ML IJ SOLN
INTRAMUSCULAR | Status: AC
  Filled 2018-09-09: qty 5

## 2018-09-09 MED ORDER — ARTIFICIAL TEARS OPHTHALMIC OINT
TOPICAL_OINTMENT | OPHTHALMIC | Status: AC
  Filled 2018-09-09: qty 3.5

## 2018-09-09 MED ORDER — PROMETHAZINE HCL 25 MG/ML IJ SOLN
6.2500 mg | Freq: Once | INTRAMUSCULAR | Status: AC
  Administered 2018-09-09: 6.25 mg via INTRAVENOUS

## 2018-09-09 MED ORDER — MIDAZOLAM HCL 2 MG/2ML IJ SOLN
2.0000 mg | Freq: Once | INTRAMUSCULAR | Status: AC
  Administered 2018-09-09: 2 mg via INTRAVENOUS

## 2018-09-09 MED ORDER — PROMETHAZINE HCL 25 MG/ML IJ SOLN
INTRAMUSCULAR | Status: AC
  Filled 2018-09-09: qty 1

## 2018-09-09 MED ORDER — OXYCODONE HCL 5 MG PO TABS
5.0000 mg | ORAL_TABLET | Freq: Once | ORAL | Status: AC | PRN
  Administered 2018-09-09: 5 mg via ORAL

## 2018-09-09 MED ORDER — ACETAMINOPHEN 160 MG/5ML PO SOLN: 1000.0000 mg | Freq: Once | ORAL | Status: DC | PRN

## 2018-09-09 MED ORDER — DEXAMETHASONE SODIUM PHOSPHATE 10 MG/ML IJ SOLN
INTRAMUSCULAR | Status: DC | PRN
  Administered 2018-09-09: 10 mg via INTRAVENOUS

## 2018-09-09 MED ORDER — OXYCODONE HCL 5 MG/5ML PO SOLN: 5.0000 mg | Freq: Once | ORAL | Status: AC | PRN

## 2018-09-09 MED ORDER — ACETAMINOPHEN 500 MG PO TABS: 1000.0000 mg | ORAL_TABLET | Freq: Once | ORAL | Status: DC | PRN

## 2018-09-09 MED ORDER — PROPOFOL 10 MG/ML IV BOLUS
INTRAVENOUS | Status: DC | PRN
  Administered 2018-09-09: 200 mg via INTRAVENOUS

## 2018-09-09 MED ORDER — BUPIVACAINE-EPINEPHRINE (PF) 0.5% -1:200000 IJ SOLN
INTRAMUSCULAR | Status: DC | PRN
  Administered 2018-09-09: 30 mL via PERINEURAL

## 2018-09-09 MED ORDER — ACETAMINOPHEN 500 MG PO TABS
ORAL_TABLET | ORAL | Status: AC
  Administered 2018-09-09: 1000 mg via ORAL
  Filled 2018-09-09: qty 2

## 2018-09-09 MED ORDER — ONDANSETRON HCL 4 MG/2ML IJ SOLN
INTRAMUSCULAR | Status: AC
  Filled 2018-09-09: qty 2

## 2018-09-09 MED ORDER — SENNA-DOCUSATE SODIUM 8.6-50 MG PO TABS: 2.0000 | ORAL_TABLET | Freq: Every day | ORAL | 1 refills | Status: DC

## 2018-09-09 MED ORDER — SUCCINYLCHOLINE CHLORIDE 200 MG/10ML IV SOSY
PREFILLED_SYRINGE | INTRAVENOUS | Status: AC
  Filled 2018-09-09: qty 20

## 2018-09-09 MED ORDER — FENTANYL CITRATE (PF) 100 MCG/2ML IJ SOLN
50.0000 ug | INTRAMUSCULAR | Status: DC | PRN
  Administered 2018-09-09 (×3): 50 ug via INTRAVENOUS

## 2018-09-09 MED ORDER — ACETAMINOPHEN 500 MG PO TABS
1000.0000 mg | ORAL_TABLET | Freq: Once | ORAL | Status: AC
  Administered 2018-09-09: 1000 mg via ORAL

## 2018-09-09 MED ORDER — EPHEDRINE 5 MG/ML INJ
INTRAVENOUS | Status: AC
  Filled 2018-09-09: qty 10

## 2018-09-09 MED ORDER — DEXAMETHASONE SODIUM PHOSPHATE 10 MG/ML IJ SOLN
INTRAMUSCULAR | Status: AC
  Filled 2018-09-09: qty 1

## 2018-09-09 MED ORDER — LIDOCAINE 2% (20 MG/ML) 5 ML SYRINGE
INTRAMUSCULAR | Status: AC
  Filled 2018-09-09: qty 5

## 2018-09-09 MED ORDER — ONDANSETRON HCL 4 MG/2ML IJ SOLN
INTRAMUSCULAR | Status: DC | PRN
  Administered 2018-09-09: 4 mg via INTRAVENOUS

## 2018-09-09 MED ORDER — CEFAZOLIN SODIUM-DEXTROSE 2-4 GM/100ML-% IV SOLN
2.0000 g | INTRAVENOUS | Status: AC
  Administered 2018-09-09: 2 g via INTRAVENOUS

## 2018-09-09 MED ORDER — ROCURONIUM BROMIDE 50 MG/5ML IV SOSY
PREFILLED_SYRINGE | INTRAVENOUS | Status: AC
  Filled 2018-09-09: qty 5

## 2018-09-09 MED ORDER — LIDOCAINE 2% (20 MG/ML) 5 ML SYRINGE
INTRAMUSCULAR | Status: DC | PRN
  Administered 2018-09-09: 60 mg via INTRAVENOUS

## 2018-09-09 MED ORDER — FENTANYL CITRATE (PF) 100 MCG/2ML IJ SOLN
50.0000 ug | Freq: Once | INTRAMUSCULAR | Status: AC
  Administered 2018-09-09: 50 ug via INTRAVENOUS

## 2018-09-09 MED ORDER — MIDAZOLAM HCL 5 MG/5ML IJ SOLN
INTRAMUSCULAR | Status: DC | PRN
  Administered 2018-09-09: 2 mg via INTRAVENOUS

## 2018-09-09 MED ORDER — 0.9 % SODIUM CHLORIDE (POUR BTL) OPTIME
TOPICAL | Status: DC | PRN
  Administered 2018-09-09: 1000 mL

## 2018-09-09 MED ORDER — GLYCOPYRROLATE PF 0.2 MG/ML IJ SOSY
PREFILLED_SYRINGE | INTRAMUSCULAR | Status: AC
  Filled 2018-09-09: qty 1

## 2018-09-09 MED ORDER — PHENYLEPHRINE 40 MCG/ML (10ML) SYRINGE FOR IV PUSH (FOR BLOOD PRESSURE SUPPORT)
PREFILLED_SYRINGE | INTRAVENOUS | Status: AC
  Filled 2018-09-09: qty 10

## 2018-09-09 MED ORDER — FENTANYL CITRATE (PF) 100 MCG/2ML IJ SOLN
INTRAMUSCULAR | Status: AC
  Administered 2018-09-09: 50 ug via INTRAVENOUS
  Filled 2018-09-09: qty 2

## 2018-09-09 MED ORDER — CEFAZOLIN SODIUM-DEXTROSE 2-4 GM/100ML-% IV SOLN
INTRAVENOUS | Status: AC
  Filled 2018-09-09: qty 100

## 2018-09-09 MED ORDER — FENTANYL CITRATE (PF) 100 MCG/2ML IJ SOLN: 25.0000 ug | INTRAMUSCULAR | Status: DC | PRN

## 2018-09-09 MED ORDER — ACETAMINOPHEN 10 MG/ML IV SOLN: 1000.0000 mg | Freq: Once | INTRAVENOUS | Status: DC | PRN

## 2018-09-09 MED ORDER — OXYCODONE HCL 5 MG PO TABS
ORAL_TABLET | ORAL | Status: AC
  Filled 2018-09-09: qty 1

## 2018-09-09 MED ORDER — FENTANYL CITRATE (PF) 100 MCG/2ML IJ SOLN
INTRAMUSCULAR | Status: AC
  Filled 2018-09-09: qty 2

## 2018-09-09 MED ORDER — GLYCOPYRROLATE 0.2 MG/ML IJ SOLN
INTRAMUSCULAR | Status: DC | PRN
  Administered 2018-09-09 (×2): 0.1 mg via INTRAVENOUS

## 2018-09-09 MED ORDER — LACTATED RINGERS IV SOLN
INTRAVENOUS | Status: DC
  Administered 2018-09-09: 11:00:00 via INTRAVENOUS

## 2018-09-09 MED ORDER — OXYCODONE HCL 5 MG PO TABS: 5.0000 mg | ORAL_TABLET | ORAL | 0 refills | Status: DC | PRN

## 2018-09-09 MED ORDER — BACLOFEN 10 MG PO TABS: 10.0000 mg | ORAL_TABLET | Freq: Three times a day (TID) | ORAL | 0 refills | Status: DC

## 2018-09-09 MED ORDER — ONDANSETRON HCL 4 MG/2ML IJ SOLN
INTRAMUSCULAR | Status: AC
  Filled 2018-09-09: qty 4

## 2018-09-09 MED ORDER — MIDAZOLAM HCL 2 MG/2ML IJ SOLN
INTRAMUSCULAR | Status: AC
  Filled 2018-09-09: qty 2

## 2018-09-09 MED ORDER — ONDANSETRON 4 MG PO TBDP: 4.0000 mg | ORAL_TABLET | Freq: Three times a day (TID) | ORAL | 0 refills | Status: DC | PRN

## 2018-09-09 SURGICAL SUPPLY — 53 items
BANDAGE ESMARK 6X9 LF (GAUZE/BANDAGES/DRESSINGS) IMPLANT
BNDG CMPR 9X6 STRL LF SNTH (GAUZE/BANDAGES/DRESSINGS) ×1
BNDG CMPR MED 15X6 ELC VLCR LF (GAUZE/BANDAGES/DRESSINGS) ×1
BNDG ELASTIC 6X15 VLCR STRL LF (GAUZE/BANDAGES/DRESSINGS) ×2 IMPLANT
BNDG ESMARK 6X9 LF (GAUZE/BANDAGES/DRESSINGS) ×3
BOOTCOVER CLEANROOM LRG (PROTECTIVE WEAR) ×2 IMPLANT
CHLORAPREP W/TINT 26ML (MISCELLANEOUS) ×2 IMPLANT
CLOSURE WOUND 1/2 X4 (GAUZE/BANDAGES/DRESSINGS) ×1
COVER WAND RF STERILE (DRAPES) ×3 IMPLANT
CUFF TOURNIQUET SINGLE 34IN LL (TOURNIQUET CUFF) IMPLANT
CUFF TOURNIQUET SINGLE 44IN (TOURNIQUET CUFF) IMPLANT
DRAPE U-SHAPE 47X51 STRL (DRAPES) ×2 IMPLANT
DURAPREP 26ML APPLICATOR (WOUND CARE) IMPLANT
ELECT REM PT RETURN 9FT ADLT (ELECTROSURGICAL) ×3
ELECTRODE REM PT RTRN 9FT ADLT (ELECTROSURGICAL) IMPLANT
GAUZE SPONGE 4X4 12PLY STRL LF (GAUZE/BANDAGES/DRESSINGS) ×2 IMPLANT
GLOVE BIOGEL PI IND STRL 8 (GLOVE) ×1 IMPLANT
GLOVE BIOGEL PI INDICATOR 8 (GLOVE) ×2
GLOVE BIOGEL PI ORTHO PRO SZ8 (GLOVE) ×4
GLOVE ORTHO TXT STRL SZ7.5 (GLOVE) ×3 IMPLANT
GLOVE PI ORTHO PRO STRL SZ8 (GLOVE) ×1 IMPLANT
GLOVE SURG ORTHO 8.0 STRL STRW (GLOVE) ×4 IMPLANT
GOWN STRL REUS W/ TWL XL LVL3 (GOWN DISPOSABLE) ×1 IMPLANT
GOWN STRL REUS W/TWL 2XL LVL3 (GOWN DISPOSABLE) ×3 IMPLANT
GOWN STRL REUS W/TWL XL LVL3 (GOWN DISPOSABLE) ×9
IMMOBILIZER KNEE 22 UNIV (SOFTGOODS) ×2 IMPLANT
KIT TURNOVER KIT B (KITS) ×3 IMPLANT
NDL STRAIGHT KEITH (NEEDLE) IMPLANT
NDL TAPERED W/ NITINOL LOOP (MISCELLANEOUS) IMPLANT
NEEDLE STRAIGHT KEITH (NEEDLE) ×3 IMPLANT
NEEDLE TAPERED W/ NITINOL LOOP (MISCELLANEOUS) ×3 IMPLANT
PACK ORTHO EXTREMITY (CUSTOM PROCEDURE TRAY) IMPLANT
PAD ABD 8X10 STRL (GAUZE/BANDAGES/DRESSINGS) ×2 IMPLANT
PAD ARMBOARD 7.5X6 YLW CONV (MISCELLANEOUS) ×6 IMPLANT
PAD CAST 4YDX4 CTTN HI CHSV (CAST SUPPLIES) IMPLANT
PADDING CAST COTTON 4X4 STRL (CAST SUPPLIES) ×3
SPLINT PLASTER CAST XFAST 5X30 (CAST SUPPLIES) IMPLANT
SPLINT PLASTER XFAST SET 5X30 (CAST SUPPLIES) ×2
STOCKINETTE IMPERVIOUS LG (DRAPES) ×2 IMPLANT
STRIP CLOSURE SKIN 1/2X4 (GAUZE/BANDAGES/DRESSINGS) ×1 IMPLANT
SUCTION FRAZIER HANDLE 10FR (MISCELLANEOUS) ×2
SUCTION TUBE FRAZIER 10FR DISP (MISCELLANEOUS) IMPLANT
SUT FIBERWIRE #2 38 T-5 BLUE (SUTURE)
SUT VIC AB 0 CT1 27 (SUTURE) ×9
SUT VIC AB 0 CT1 27XBRD ANBCTR (SUTURE) IMPLANT
SUT VIC AB 2-0 CTB1 (SUTURE) ×4 IMPLANT
SUT VIC AB 3-0 SH 18 (SUTURE) ×2 IMPLANT
SUTURE FIBERWR #2 38 T-5 BLUE (SUTURE) IMPLANT
SUTURE TAPE 1.3 40 TPR END (SUTURE) IMPLANT
SUTURETAPE 1.3 40 TPR END (SUTURE) ×6
TUBE CONNECTING 12'X1/4 (SUCTIONS) ×1
TUBE CONNECTING 12X1/4 (SUCTIONS) ×1 IMPLANT
YANKAUER SUCT BULB TIP NO VENT (SUCTIONS) ×2 IMPLANT

## 2018-09-09 NOTE — Op Note (Signed)
09/09/2018  2:08 PM  PATIENT:  William Wall    PRE-OPERATIVE DIAGNOSIS: Left patellar tendon rupture  POST-OPERATIVE DIAGNOSIS:  Same  PROCEDURE:  PATELLA TENDON REPAIR, LEFT  SURGEON:  Eulas Post, MD  PHYSICIAN ASSISTANT: Janace Litten, OPA-C, present and scrubbed throughout the case, critical for completion in a timely fashion, and for retraction, instrumentation, and closure.  ANESTHESIA:   General with femoral nerve block  PREOPERATIVE INDICATIONS:  William Wall is a  28 y.o. male with a diagnosis of LEFT PATELLA TENDON TEAR who failed conservative measures and elected for surgical management.    The risks benefits and alternatives were discussed with the patient preoperatively including but not limited to the risks of infection, bleeding, nerve injury, cardiopulmonary complications, the need for revision surgery, among others, and the patient was willing to proceed.  ESTIMATED BLOOD LOSS: Minimal  OPERATIVE IMPLANTS: Suture tape x2 woven through the patellar tendon brought through 3 drill holes in the patella  OPERATIVE FINDINGS: 100% disruption of the patellar tendon off of the distal patellar pole.  The retinaculum had complicated tears up and down the tendon and medial and laterally.  OPERATIVE PROCEDURE: The patient was brought to the operating room and placed in the supine position.  General anesthesia was administered.  IV antibiotics were given.  The left lower extremity was prepped and draped in usual sterile fashion.  Timeout was performed.  The leg was elevated and exsanguinated and a tourniquet was inflated.  Incision was made in the midline.  Dissection carried down in the patella tendon was identified, cleaned, the shredded ends were freshened, the attachment was freshened at the distal pole of the patella with a rongure.  A total of two #2 suture tape were woven up and down the patellar tendon, and then brought through drill holes in the patella.  The central most  limbs were brought through the center hole, the medial and lateral limbs brought on either side, the sutures were tied over the top of the patella after using a free needle to minimize any soft tissue bridge between the exit holes.  Excellent reapproximation of the patellar tendon was achieved.  The wounds were irrigated copiously, retinacular tears were repaired with 0 Vicryl followed by Vicryl for the subcutaneous tissue with routine closure with the skin.  A long-leg splint was applied after Steri-Strips and sterile gauze were placed.  The tourniquet was released, the patient awakened and returned to the PACU in stable and satisfactory condition.  There were no complications and he tolerated the procedure well.

## 2018-09-09 NOTE — Transfer of Care (Signed)
Immediate Anesthesia Transfer of Care Note  Patient: William Wall  Procedure(s) Performed: PATELLA TENDON REPAIR, LEFT (Left Knee)  Patient Location: PACU  Anesthesia Type:GA combined with regional for post-op pain  Level of Consciousness: drowsy and patient cooperative  Airway & Oxygen Therapy: Patient Spontanous Breathing and Patient connected to face mask oxygen  Post-op Assessment: Report given to RN and Post -op Vital signs reviewed and stable  Post vital signs: Reviewed and stable  Last Vitals:  Vitals Value Taken Time  BP 111/58 09/09/2018  3:45 PM  Temp    Pulse 98 09/09/2018  3:47 PM  Resp 17 09/09/2018  3:47 PM  SpO2 98 % 09/09/2018  3:47 PM  Vitals shown include unvalidated device data.  Last Pain:  Vitals:   09/09/18 1545  TempSrc:   PainSc: (P) 0-No pain      Patients Stated Pain Goal: 4 (09/09/18 1041)  Complications: No apparent anesthesia complications

## 2018-09-09 NOTE — Anesthesia Procedure Notes (Signed)
Procedure Name: LMA Insertion Date/Time: 09/09/2018 2:09 PM Performed by: Ponciano Ort, CRNA Pre-anesthesia Checklist: Patient identified, Emergency Drugs available, Suction available and Patient being monitored Patient Re-evaluated:Patient Re-evaluated prior to induction Oxygen Delivery Method: Circle system utilized Preoxygenation: Pre-oxygenation with 100% oxygen Induction Type: IV induction LMA: LMA inserted LMA Size: 5.0 Number of attempts: 1 Placement Confirmation: positive ETCO2 Tube secured with: Tape Dental Injury: Teeth and Oropharynx as per pre-operative assessment

## 2018-09-09 NOTE — Anesthesia Preprocedure Evaluation (Signed)
Anesthesia Evaluation  Patient identified by MRN, date of birth, ID band Patient awake    Reviewed: Allergy & Precautions, NPO status , Patient's Chart, lab work & pertinent test results  History of Anesthesia Complications Negative for: history of anesthetic complications  Airway Mallampati: II  TM Distance: >3 FB Neck ROM: Full    Dental  (+) Teeth Intact   Pulmonary neg pulmonary ROS,    breath sounds clear to auscultation       Cardiovascular negative cardio ROS   Rhythm:Regular     Neuro/Psych negative neurological ROS  negative psych ROS   GI/Hepatic negative GI ROS, Neg liver ROS,   Endo/Other  negative endocrine ROS  Renal/GU negative Renal ROS     Musculoskeletal LEFT PATELLA TENDON TEAR   Abdominal   Peds  Hematology negative hematology ROS (+)   Anesthesia Other Findings   Reproductive/Obstetrics                             Anesthesia Physical Anesthesia Plan  ASA: I  Anesthesia Plan: General and Regional   Post-op Pain Management:  Regional for Post-op pain   Induction: Intravenous  PONV Risk Score and Plan: 2 and Ondansetron and Dexamethasone  Airway Management Planned: LMA  Additional Equipment: None  Intra-op Plan:   Post-operative Plan: Extubation in OR  Informed Consent: I have reviewed the patients History and Physical, chart, labs and discussed the procedure including the risks, benefits and alternatives for the proposed anesthesia with the patient or authorized representative who has indicated his/her understanding and acceptance.     Dental advisory given  Plan Discussed with: CRNA and Surgeon  Anesthesia Plan Comments:         Anesthesia Quick Evaluation

## 2018-09-09 NOTE — Discharge Summary (Signed)
Physician Discharge Summary  Patient ID: William Wall MRN: [id number] DOB/AGE: 1991/02/28 28 y.o.  Admit date: 09/09/2018 Discharge date: 09/09/2018  Admission Diagnoses:  Patellar tendon rupture, left, initial encounter  Discharge Diagnoses:  Principal Problem:   Patellar tendon rupture, left, initial encounter   Past Medical History:  Diagnosis Date  . Medical history non-contributory   . Patellar tendon rupture, left, initial encounter 09/09/2018    Surgeries: Procedure(s): PATELLA TENDON REPAIR, LEFT on 09/09/2018   Consultants (if any):   Discharged Condition: Improved  Hospital Course: William Wall is an 29 y.o. male who was admitted 09/09/2018 with a diagnosis of Patellar tendon rupture, left, initial encounter and went to the operating room on 09/09/2018 and underwent the above named procedures.    He was given perioperative antibiotics:  Anti-infectives (From admission, onward)   Start     Dose/Rate Route Frequency Ordered Stop   09/09/18 1200  ceFAZolin (ANCEF) IVPB 2g/100 mL premix     2 g 200 mL/hr over 30 Minutes Intravenous On call to O.R. 09/09/18 0955 09/09/18 1415   09/09/18 1002  ceFAZolin (ANCEF) 2-4 GM/100ML-% IVPB    Note to Pharmacy:  Shireen Quan   : cabinet override      09/09/18 1002 09/09/18 1410    .  He was given sequential compression devices, early ambulation for DVT prophylaxis.  He benefited maximally from the hospital stay and there were no complications.  He was discharged from the pacu.  Recent vital signs:  Vitals:   09/09/18 1340 09/09/18 1345  BP:    Pulse: 88 77  Resp: 13 14  Temp:    SpO2: 99% 99%    Recent laboratory studies:  Lab Results  Component Value Date   HGB 14.0 02/09/2008   Lab Results  Component Value Date   WBC 11.2 02/09/2008   PLT 290 02/09/2008   No results found for: INR Lab Results  Component Value Date   NA 142 02/09/2008   K 3.6 02/09/2008   CL 100 02/09/2008   CO2 29 02/09/2008   BUN 17  02/09/2008   CREATININE 1.1 02/09/2008   GLUCOSE 90 02/09/2008    Discharge Medications:   Allergies as of 09/09/2018   No Known Allergies     Medication List    STOP taking these medications   ibuprofen 200 MG tablet Commonly known as:  ADVIL,MOTRIN     TAKE these medications   baclofen 10 MG tablet Commonly known as:  LIORESAL Take 1 tablet (10 mg total) by mouth 3 (three) times daily. As needed for muscle spasm   magnesium oxide 400 MG tablet Commonly known as:  MAG-OX Take 400 mg by mouth daily.   multivitamin tablet Take 1 tablet by mouth daily.   ondansetron 4 MG disintegrating tablet Commonly known as:  ZOFRAN ODT Take 1 tablet (4 mg total) by mouth every 8 (eight) hours as needed for nausea or vomiting.   OVER THE COUNTER MEDICATION Take 1 tablet by mouth daily. Move free tablets   oxyCODONE 5 MG immediate release tablet Commonly known as:  ROXICODONE Take 1 tablet (5 mg total) by mouth every 4 (four) hours as needed for severe pain.   sennosides-docusate sodium 8.6-50 MG tablet Commonly known as:  SENOKOT-S Take 2 tablets by mouth daily.       Diagnostic Studies: Dg Knee Complete 4 Views Left  Result Date: 09/08/2018 CLINICAL DATA:  Left knee injury playing basketball. Leg is stuck in bed position. EXAM: LEFT  KNEE - COMPLETE 4+ VIEW COMPARISON:  No comparison studies available. FINDINGS: Four views study limited by positioning. No gross fracture of the distal femur or proximal tibia evident. No proximal fibular fracture noted. 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 No substantial joint effusion. No intact patellar tendon evident on lateral film. 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 Electronically Signed   By: Kennith Center M.D.   On: 09/08/2018 19:09    Disposition: Discharge disposition: 01-Home or Self Care         Follow-up Information    Teryl Lucy, MD. Schedule  an appointment as soon as possible for a visit in 2 weeks.   Specialty:  Orthopedic Surgery Contact information: [address] [place] [address] [phone]            Signed: Eulas Post 09/09/2018, 3:33 PM

## 2018-09-09 NOTE — H&P (Signed)
PREOPERATIVE H&P  Chief Complaint: Left patellar tendon rupture  HPI: William Wall is a 28 y.o. male who presents for preoperative history and physical with a diagnosis of left patellar tendon rupture.  Injury happened yesterday while playing basketball.  Acute onset pop, pain, unable to walk, unable to lift his leg.  Seen in the emergency room last night by the ER providers, arranged for surgery the following day.. Symptoms are rated as moderate to severe, and have been worsening.  This is significantly impairing activities of daily living.  He has elected for surgical management.   Past Medical History:  Diagnosis Date  . Medical history non-contributory    Past Surgical History:  Procedure Laterality Date  . NO PAST SURGERIES     Social History   Socioeconomic History  . Marital status: Married    Spouse name: Not on file  . Number of children: Not on file  . Years of education: Not on file  . Highest education level: Not on file  Occupational History  . Not on file  Social Needs  . Financial resource strain: Not on file  . Food insecurity:    Worry: Not on file    Inability: Not on file  . Transportation needs:    Medical: Not on file    Non-medical: Not on file  Tobacco Use  . Smoking status: Never Smoker  . Smokeless tobacco: Never Used  Substance and Sexual Activity  . Alcohol use: Yes    Comment: once or twice a month  . Drug use: No  . Sexual activity: Not on file  Lifestyle  . Physical activity:    Days per week: Not on file    Minutes per session: Not on file  . Stress: Not on file  Relationships  . Social connections:    Talks on phone: Not on file    Gets together: Not on file    Attends religious service: Not on file    Active member of club or organization: Not on file    Attends meetings of clubs or organizations: Not on file    Relationship status: Not on file  Other Topics Concern  . Not on file  Social History Narrative  . Not on file    History reviewed. No pertinent family history. No Known Allergies Prior to Admission medications   Medication Sig Start Date End Date Taking? Authorizing Provider  ibuprofen (ADVIL,MOTRIN) 200 MG tablet Take 600 mg by mouth every 6 (six) hours as needed for moderate pain.    Yes [provider]  magnesium oxide (MAG-OX) 400 MG tablet Take 400 mg by mouth daily.   Yes [provider]  Multiple Vitamin (MULTIVITAMIN) tablet Take 1 tablet by mouth daily.   Yes [provider]  OVER THE COUNTER MEDICATION Take 1 tablet by mouth daily. Move free tablets   Yes [provider]  ondansetron (ZOFRAN ODT) 4 MG disintegrating tablet Take 1 tablet (4 mg total) by mouth every 8 (eight) hours as needed for nausea or vomiting. Patient not taking: Reported on 09/09/2018 09/08/18   Pricilla Loveless, MD     Positive ROS: All other systems have been reviewed and were otherwise negative with the exception of those mentioned in the HPI and as above.  Physical Exam: General: Alert, no acute distress Cardiovascular: No pedal edema Respiratory: No cyanosis, no use of accessory musculature GI: No organomegaly, abdomen is soft and non-tender Skin: No lesions in the area of chief complaint Neurologic:  Sensation intact distally Psychiatric: Patient is competent for consent with normal mood and affect Lymphatic: No axillary or cervical lymphadenopathy  MUSCULOSKELETAL: Left leg has positive soft tissue swelling diffusely around the patella.  Positive ecchymosis.  Unable to do a straight leg raise.  Skin is intact.  Assessment: Left patellar tendon rupture   Plan: Plan for Procedure(s): PATELLA TENDON REPAIR, LEFT  The risks benefits and alternatives were discussed with the patient including but not limited to the risks of nonoperative treatment, versus surgical intervention including infection, bleeding, nerve injury,  blood clots, cardiopulmonary complications, stiffness,  recurrent rupture, weakness, morbidity, mortality, among others, and they were willing to proceed.      Eulas Post, MD Cell [phone]   09/09/2018 12:55 PM

## 2018-09-09 NOTE — Discharge Instructions (Signed)

## 2018-09-09 NOTE — Anesthesia Procedure Notes (Signed)
Anesthesia Regional Block: Femoral nerve block   Pre-Anesthetic Checklist: ,, timeout performed, Correct Patient, Correct Site, Correct Laterality, Correct Procedure, Correct Position, site marked, Risks and benefits discussed,  Surgical consent,  Pre-op evaluation,  At surgeon's request and post-op pain management  Laterality: Left  Prep: chloraprep       Needles:  Injection technique: Single-shot     Needle Length: 9cm  Needle Gauge: 22     Additional Needles: Arrow StimuQuik ECHO Echogenic Stimulating PNB Needle  Procedures:,,,, ultrasound used (permanent image in chart),,,,  Narrative:  Start time: 09/09/2018 11:31 AM End time: 09/09/2018 11:37 AM Injection made incrementally with aspirations every 5 mL.  Performed by: Personally  Anesthesiologist: Val Eagle, MD

## 2018-09-11 ENCOUNTER — Encounter (HOSPITAL_COMMUNITY): Payer: Self-pay | Admitting: Orthopedic Surgery

## 2018-09-12 NOTE — Anesthesia Postprocedure Evaluation (Signed)
Anesthesia Post Note  Patient: William Wall  Procedure(s) Performed: PATELLA TENDON REPAIR, LEFT (Left Knee)     Patient location during evaluation: PACU Anesthesia Type: Regional and General Level of consciousness: awake and alert Pain management: pain level controlled Vital Signs Assessment: post-procedure vital signs reviewed and stable Respiratory status: spontaneous breathing, nonlabored ventilation, respiratory function stable and patient connected to nasal cannula oxygen Cardiovascular status: blood pressure returned to baseline and stable Postop Assessment: no apparent nausea or vomiting Anesthetic complications: no    Last Vitals:  Vitals:   09/09/18 1700 09/09/18 1715  BP: (!) 120/50 (!) 116/54  Pulse: 68 61  Resp: 15 14  Temp:  37.1 C  SpO2: 98% 95%    Last Pain:  Vitals:   09/09/18 1712  TempSrc:   PainSc: 7                  Gervis Gaba

## 2018-10-28 ENCOUNTER — Telehealth: Payer: BLUE CROSS/BLUE SHIELD | Admitting: Nurse Practitioner

## 2018-10-28 DIAGNOSIS — L237 Allergic contact dermatitis due to plants, except food: Secondary | ICD-10-CM

## 2018-10-28 MED ORDER — PREDNISONE 10 MG (21) PO TBPK: ORAL_TABLET | ORAL | 0 refills | Status: DC

## 2018-10-28 NOTE — Progress Notes (Signed)

## 2019-09-15 ENCOUNTER — Telehealth: Payer: BLUE CROSS/BLUE SHIELD | Admitting: Physician Assistant

## 2019-09-15 DIAGNOSIS — L259 Unspecified contact dermatitis, unspecified cause: Secondary | ICD-10-CM

## 2019-09-15 MED ORDER — PREDNISOLONE 15 MG/5ML PO SOLN: ORAL | 0 refills | Status: DC

## 2019-09-15 NOTE — Addendum Note (Signed)
Addended by: Remus Loffler on: 09/15/2019 05:21 PM   Modules accepted: Orders

## 2019-09-15 NOTE — Progress Notes (Signed)
E Visit for Rash  We are sorry that you are not feeling well. Here is how we plan to help!  Based on what you shared with me it looks like you have contact dermatitis.  Contact dermatitis is a skin rash caused by something that touches the skin and causes irritation or inflammation.  Your skin may be red, swollen, dry, cracked, and itch.  The rash should go away in a few days but can last a few weeks.  If you get a rash, it's important to figure out what caused it so the irritant can be avoided in the future. and I have prescribed Prednisone 10 mg daily for 5 days    Prednisone 10 mg daily for 6 days (see taper instructions below)  Directions for 6 day taper: Day 1: 2 tablets before breakfast, 1 after both lunch & dinner and 2 at bedtime Day 2: 1 tab before breakfast, 1 after both lunch & dinner and 2 at bedtime Day 3: 1 tab at each meal & 1 at bedtime Day 4: 1 tab at breakfast, 1 at lunch, 1 at bedtime Day 5: 1 tab at breakfast & 1 tab at bedtime Day 6: 1 tab at breakfast      HOME CARE:   Take cool showers and avoid direct sunlight.  Apply cool compress or wet dressings.  Take a bath in an oatmeal bath.  Sprinkle content of one Aveeno packet under running faucet with comfortably warm water.  Bathe for 15-20 minutes, 1-2 times daily.  Pat dry with a towel. Do not rub the rash.  Use hydrocortisone cream.  Take an antihistamine like Benadryl for widespread rashes that itch.  The adult dose of Benadryl is 25-50 mg by mouth 4 times daily.  Caution:  This type of medication may cause sleepiness.  Do not drink alcohol, drive, or operate dangerous machinery while taking antihistamines.  Do not take these medications if you have prostate enlargement.  Read package instructions thoroughly on all medications that you take.  GET HELP RIGHT AWAY IF:   Symptoms don't go away after treatment.  Severe itching that persists.  If you rash spreads or swells.  If you rash begins to  smell.  If it blisters and opens or develops a yellow-brown crust.  You develop a fever.  You have a sore throat.  You become short of breath.  MAKE SURE YOU:  Understand these instructions. Will watch your condition. Will get help right away if you are not doing well or get worse.  Thank you for choosing an e-visit. Your e-visit answers were reviewed by a board certified advanced clinical practitioner to complete your personal care plan. Depending upon the condition, your plan could have included both over the counter or prescription medications. Please review your pharmacy choice. Be sure that the pharmacy you have chosen is open so that you can pick up your prescription now.  If there is a problem you may message your provider in MyChart to have the prescription routed to another pharmacy. Your safety is important to [place]. If you have drug allergies check your prescription carefully.  For the next 24 hours, you can use MyChart to ask questions about today's visit, request a non-urgent call back, or ask for a work or school excuse from your e-visit provider. You will get an email in the next two days asking about your experience. I hope that your e-visit has been valuable and will speed your recovery.   Prudy Feeler PA-C  Approximately 5 minutes was spent documenting and reviewing patient's chart.   

## 2019-10-01 ENCOUNTER — Telehealth: Payer: Self-pay | Admitting: Physician Assistant

## 2019-10-01 DIAGNOSIS — R399 Unspecified symptoms and signs involving the genitourinary system: Secondary | ICD-10-CM

## 2019-10-01 NOTE — Progress Notes (Signed)
Based on what you shared with me, I feel your condition warrants further evaluation and I recommend that you be seen for a face to face office visit.  Male bladder infections are not very common.  We worry about prostate or kidney conditions.  The standard of care is to examine the abdomen and kidneys, and to do a urine and blood test to make sure that something more serious is not going on.  We recommend that you see a provider today.  If your doctor's office is closed [facility] has the following Urgent Cares:    NOTE: If you entered your credit card information for this eVisit, you will not be charged. You may see a "hold" on your card for the $35 but that hold will drop off and you will not have a charge processed.   If you are having a true medical emergency please call 911.     For an urgent face to face visit, [facility] has four urgent care centers for your convenience:    NEW:  [facility] [phone] [address] [place], [place] [address] .  Monday - Friday 10 am - 6 pm    . [facility]    [phone]                  Get Driving Directions  [address] [place], [place] [address] . 10 am to 8 pm Monday-Friday . 12 pm to 8 pm Saturday-Sunday   . [facility]  [phone]                  Get Driving Directions  [address], [address] [place], [place] [address] . 8 am to 8 pm Monday-Friday . 9 am to 6 pm Saturday . 11 am to 6 pm Sunday   . [facility] [place]  [phone]                  Get Driving Directions   [address] [place], [place] [address] . 8 am to 8 pm Monday-Friday . 8 am to 4 pm Saturday-Sunday    .  [facility]                    Get Driving Directions  [phone]  [address] [place], [place] [address]  . Monday-Friday, 12 PM to 6 PM    Your e-visit  answers were reviewed by a board certified advanced clinical practitioner to complete your personal care plan.  Thank you for using e-Visits.  Greater than 5 minutes, yet less than 10 minutes of time have been spent researching, coordinating, and implementing care for this patient today  

## 2021-05-03 ENCOUNTER — Telehealth: Payer: Self-pay | Admitting: Emergency Medicine

## 2021-05-03 DIAGNOSIS — B9689 Other specified bacterial agents as the cause of diseases classified elsewhere: Secondary | ICD-10-CM

## 2021-05-03 DIAGNOSIS — J028 Acute pharyngitis due to other specified organisms: Secondary | ICD-10-CM

## 2021-05-03 MED ORDER — AMOXICILLIN 500 MG PO CAPS: 500.0000 mg | ORAL_CAPSULE | Freq: Two times a day (BID) | ORAL | 0 refills | Status: AC

## 2021-05-03 NOTE — Progress Notes (Signed)
I have spent 5 minutes in review of e-visit questionnaire, review and updating patient chart, medical decision making and response to patient.   Lular Letson, PA-C    

## 2021-05-03 NOTE — Progress Notes (Signed)

## 2022-08-07 ENCOUNTER — Telehealth: Payer: Self-pay | Admitting: Nurse Practitioner

## 2022-08-07 DIAGNOSIS — J069 Acute upper respiratory infection, unspecified: Secondary | ICD-10-CM

## 2022-08-07 MED ORDER — FLUTICASONE PROPIONATE 50 MCG/ACT NA SUSP: 2.0000 | Freq: Every day | NASAL | 0 refills | Status: DC

## 2022-08-07 MED ORDER — BENZONATATE 100 MG PO CAPS: 100.0000 mg | ORAL_CAPSULE | Freq: Three times a day (TID) | ORAL | 0 refills | Status: DC | PRN

## 2022-08-07 NOTE — Progress Notes (Signed)
I have spent 5 minutes in review of e-visit questionnaire, review and updating patient chart, medical decision making and response to patient.  ° °Emory Leaver W Kaylany Tesoriero, NP ° °  °

## 2022-08-07 NOTE — Progress Notes (Signed)

## 2022-09-27 DIAGNOSIS — R7989 Other specified abnormal findings of blood chemistry: Secondary | ICD-10-CM | POA: Diagnosis not present

## 2022-09-28 DIAGNOSIS — L658 Other specified nonscarring hair loss: Secondary | ICD-10-CM | POA: Diagnosis not present

## 2022-09-28 DIAGNOSIS — R891 Abnormal level of hormones in specimens from other organs, systems and tissues: Secondary | ICD-10-CM | POA: Diagnosis not present

## 2022-09-28 DIAGNOSIS — R5382 Chronic fatigue, unspecified: Secondary | ICD-10-CM | POA: Diagnosis not present

## 2022-12-14 DIAGNOSIS — R5382 Chronic fatigue, unspecified: Secondary | ICD-10-CM | POA: Diagnosis not present

## 2022-12-14 DIAGNOSIS — L658 Other specified nonscarring hair loss: Secondary | ICD-10-CM | POA: Diagnosis not present

## 2022-12-14 DIAGNOSIS — R891 Abnormal level of hormones in specimens from other organs, systems and tissues: Secondary | ICD-10-CM | POA: Diagnosis not present

## 2023-01-03 ENCOUNTER — Telehealth: Payer: Self-pay | Admitting: Physician Assistant

## 2023-01-03 DIAGNOSIS — J029 Acute pharyngitis, unspecified: Secondary | ICD-10-CM

## 2023-01-03 MED ORDER — AMOXICILLIN 500 MG PO TABS: 500.0000 mg | ORAL_TABLET | Freq: Two times a day (BID) | ORAL | 0 refills | Status: AC

## 2023-01-03 NOTE — Progress Notes (Signed)

## 2023-01-03 NOTE — Progress Notes (Signed)
I have spent 5 minutes in review of e-visit questionnaire, review and updating patient chart, medical decision making and response to patient.   Tequila Rottmann Cody Eller Sweis, PA-C    

## 2023-03-28 DIAGNOSIS — R891 Abnormal level of hormones in specimens from other organs, systems and tissues: Secondary | ICD-10-CM | POA: Diagnosis not present

## 2023-03-28 DIAGNOSIS — L658 Other specified nonscarring hair loss: Secondary | ICD-10-CM | POA: Diagnosis not present

## 2023-03-28 DIAGNOSIS — R5382 Chronic fatigue, unspecified: Secondary | ICD-10-CM | POA: Diagnosis not present

## 2023-04-13 ENCOUNTER — Telehealth: Payer: BC Managed Care – PPO | Admitting: Physician Assistant

## 2023-04-13 DIAGNOSIS — R066 Hiccough: Secondary | ICD-10-CM | POA: Diagnosis not present

## 2023-04-13 MED ORDER — PANTOPRAZOLE SODIUM 40 MG PO TBEC: 40.0000 mg | DELAYED_RELEASE_TABLET | Freq: Every day | ORAL | 0 refills | Status: AC

## 2023-04-13 MED ORDER — BACLOFEN 10 MG PO TABS: 10.0000 mg | ORAL_TABLET | Freq: Three times a day (TID) | ORAL | 0 refills | Status: AC

## 2023-04-13 NOTE — Progress Notes (Signed)
Virtual Visit Consent   William Wall, you are scheduled for a virtual visit with a [facility] provider today. Just as with appointments in the office, your consent must be obtained to participate. Your consent will be active for this visit and any virtual visit you may have with one of our providers in the next 365 days. If you have a MyChart account, a copy of this consent can be sent to you electronically.  As this is a virtual visit, video technology does not allow for your provider to perform a traditional examination. This may limit your provider's ability to fully assess your condition. If your provider identifies any concerns that need to be evaluated in person or the need to arrange testing (such as labs, EKG, etc.), we will make arrangements to do so. Although advances in technology are sophisticated, we cannot ensure that it will always work on either your end or our end. If the connection with a video visit is poor, the visit may have to be switched to a telephone visit. With either a video or telephone visit, we are not always able to ensure that we have a secure connection.  By engaging in this virtual visit, you consent to the provision of healthcare and authorize for your insurance to be billed (if applicable) for the services provided during this visit. Depending on your insurance coverage, you may receive a charge related to this service.  I need to obtain your verbal consent now. Are you willing to proceed with your visit today? William Wall has provided verbal consent on 04/13/2023 for a virtual visit (video or telephone). Piedad Climes, [place]  Date: 04/13/2023 7:08 PM  Virtual Visit via Video Note   I, Piedad Climes, connected with  William Wall  ([id number], [birth year]) on 04/13/23 at  7:15 PM EDT by a video-enabled telemedicine application and verified that I am speaking with the correct person using two identifiers.  Location: Patient: Virtual Visit Location Patient:  Home Provider: Virtual Visit Location Provider: Home Office   I discussed the limitations of evaluation and management by telemedicine and the availability of in person appointments. The patient expressed understanding and agreed to proceed.    History of Present Illness: William Wall is a 32 y.o. who identifies as a male who was assigned male at birth, and is being seen today for intractable hiccups in the past 1.5 days. Notes symptoms starting randomly Monday night and continued into today. Is associated with heartburn and mild reflux. Denies abdominal pain. Some chest wall tenderness from the hiccups. Denies SOB. Denies fever, chills. Took Tums last night with some relief of heartburn but not of hiccups. Denies any new prescription or OTC medications/supplements. Denies any head trauma. Cold seems to help.     HPI: HPI  Problems:  Patient Active Problem List   Diagnosis Date Noted   Patellar tendon rupture, left, initial encounter 09/09/2018    Allergies: No Known Allergies Medications:  Current Outpatient Medications:    baclofen (LIORESAL) 10 MG tablet, Take 1 tablet (10 mg total) by mouth 3 (three) times daily., Disp: 30 each, Rfl: 0   pantoprazole (PROTONIX) 40 MG tablet, Take 1 tablet (40 mg total) by mouth daily., Disp: 30 tablet, Rfl: 0   magnesium oxide (MAG-OX) 400 MG tablet, Take 400 mg by mouth daily., Disp: , Rfl:    OVER THE COUNTER MEDICATION, Take 1 tablet by mouth daily. Move free tablets, Disp: , Rfl:   Observations/Objective: Patient is well-developed, well-nourished in  no acute distress.  Resting comfortably  at home.  Head is normocephalic, atraumatic.  No labored breathing.  Speech is clear and coherent with logical content.  Patient is alert and oriented at baseline.   Assessment and Plan: 1. Intractable hiccups - baclofen (LIORESAL) 10 MG tablet; Take 1 tablet (10 mg total) by mouth 3 (three) times daily.  Dispense: 30 each; Refill: 0 - pantoprazole  (PROTONIX) 40 MG tablet; Take 1 tablet (40 mg total) by mouth daily.  Dispense: 30 tablet; Refill: 0  Suspected GERD related but want to make sure he gets further assessment if not quickly improving. Will start Pantoprazole and Baclofen. GERD Diet reviewed. Will need in-person evaluation if not substantially improving/resolving over the next few days. ER for any acutely worsening symptoms.   Follow Up Instructions: I discussed the assessment and treatment plan with the patient. The patient was provided an opportunity to ask questions and all were answered. The patient agreed with the plan and demonstrated an understanding of the instructions.  A copy of instructions were sent to the patient via MyChart unless otherwise noted below.   The patient was advised to call back or seek an in-person evaluation if the symptoms worsen or if the condition fails to improve as anticipated.  Time:  I spent 15 minutes with the patient via telehealth technology discussing the above problems/concerns.    Piedad Climes, PA-C

## 2023-04-13 NOTE — Patient Instructions (Signed)
William Wall, thank you for joining Piedad Climes, PA-C for today's virtual visit.  While this provider is not your primary care provider (PCP), if your PCP is located in our provider database this encounter information will be shared with them immediately following your visit.   A [facility] MyChart account gives you access to today's visit and all your visits, tests, and labs performed at [facility] " click here if you don't have a [facility] MyChart account or go to [email or web address]  Consent: (Patient) William Wall provided verbal consent for this virtual visit at the beginning of the encounter.  Current Medications:  Current Outpatient Medications:    baclofen (LIORESAL) 10 MG tablet, Take 1 tablet (10 mg total) by mouth 3 (three) times daily. As needed for muscle spasm, Disp: 50 tablet, Rfl: 0   benzonatate (TESSALON) 100 MG capsule, Take 1-2 capsules (100-200 mg total) by mouth 3 (three) times daily as needed for cough., Disp: 30 capsule, Rfl: 0   fluticasone (FLONASE) 50 MCG/ACT nasal spray, Place 2 sprays into both nostrils daily., Disp: 16 g, Rfl: 0   magnesium oxide (MAG-OX) 400 MG tablet, Take 400 mg by mouth daily., Disp: , Rfl:    Multiple Vitamin (MULTIVITAMIN) tablet, Take 1 tablet by mouth daily., Disp: , Rfl:    ondansetron (ZOFRAN ODT) 4 MG disintegrating tablet, Take 1 tablet (4 mg total) by mouth every 8 (eight) hours as needed for nausea or vomiting., Disp: 10 tablet, Rfl: 0   OVER THE COUNTER MEDICATION, Take 1 tablet by mouth daily. Move free tablets, Disp: , Rfl:    oxyCODONE (ROXICODONE) 5 MG immediate release tablet, Take 1 tablet (5 mg total) by mouth every 4 (four) hours as needed for severe pain., Disp: 30 tablet, Rfl: 0   prednisoLONE (PRELONE) 15 MG/5ML SOLN, Take one tsp TID 2 days, one tsp BID 2 days, one tsp QD one day, 1/2 tsp QD one day, Disp: 60 mL, Rfl: 0   sennosides-docusate sodium (SENOKOT-S) 8.6-50 MG tablet, Take 2 tablets  by mouth daily., Disp: 30 tablet, Rfl: 1   Medications ordered in this encounter:  No orders of the defined types were placed in this encounter.    *If you need refills on other medications prior to your next appointment, please contact your pharmacy*  Follow-Up: Call back or seek an in-person evaluation if the symptoms worsen or if the condition fails to improve as anticipated.  [facility] [phone]  Other Instructions Take the prescribed medications as directed. Keep hydrated. Follow dietary recommendations below. Avoid late night eating and elevate the head of your bed. If not quickly improving and resolving over the rest of the week, or any new/worsening symptoms, you need an in person evaluation ASAP.  Food Choices for Gastroesophageal Reflux Disease, Adult When you have gastroesophageal reflux disease (GERD), the foods you eat and your eating habits are very important. Choosing the right foods can help ease your discomfort. Think about working with a food expert (dietitian) to help you make good choices. What are tips for following this plan? Reading food labels Look for foods that are low in saturated fat. Foods that may help with your symptoms include: Foods that have less than 5% of daily value (DV) of fat. Foods that have 0 grams of trans fat. Cooking Do not fry your food. Cook your food by baking, steaming, grilling, or broiling. These are all methods that do not need a lot of fat for cooking. To  add flavor, try to use herbs that are low in spice and acidity. Meal planning  Choose healthy foods that are low in fat, such as: Fruits and vegetables. Whole grains. Low-fat dairy products. Lean meats, fish, and poultry. Eat small meals often instead of eating 3 large meals each day. Eat your meals slowly in a place where you are relaxed. Avoid bending over or lying down until 2-3 hours after eating. Limit high-fat foods such as fatty meats or fried  foods. Limit your intake of fatty foods, such as oils, butter, and shortening. Avoid the following as told by your doctor: Foods that cause symptoms. These may be different for different people. Keep a food diary to keep track of foods that cause symptoms. Alcohol. Drinking a lot of liquid with meals. Eating meals during the 2-3 hours before bed. Lifestyle Stay at a healthy weight. Ask your doctor what weight is healthy for you. If you need to lose weight, work with your doctor to do so safely. Exercise for at least 30 minutes on 5 or more days each week, or as told by your doctor. Wear loose-fitting clothes. Do not smoke or use any products that contain nicotine or tobacco. If you need help quitting, ask your doctor. Sleep with the head of your bed higher than your feet. Use a wedge under the mattress or blocks under the bed frame to raise the head of the bed. Chew sugar-free gum after meals. What foods should eat?  Eat a healthy, well-balanced diet of fruits, vegetables, whole grains, low-fat dairy products, lean meats, fish, and poultry. Each person is different. Foods that may cause symptoms in one person may not cause any symptoms in another person. Work with your doctor to find foods that are safe for you. The items listed above may not be a complete list of what you can eat and drink. Contact a food expert for more options. What foods should I avoid? Limiting some of these foods may help in managing the symptoms of GERD. Everyone is different. Talk with a food expert or your doctor to help you find the exact foods to avoid, if any. Fruits Any fruits prepared with added fat. Any fruits that cause symptoms. For some people, this may include citrus fruits, such as oranges, grapefruit, pineapple, and lemons. Vegetables Deep-fried vegetables. [place] fries. Any vegetables prepared with added fat. Any vegetables that cause symptoms. For some people, this may include tomatoes and tomato  products, chili peppers, onions and garlic, and horseradish. Grains Pastries or quick breads with added fat. Meats and other proteins High-fat meats, such as fatty beef or pork, hot dogs, ribs, ham, sausage, salami, and bacon. Fried meat or protein, including fried fish and fried chicken. Nuts and nut butters, in large amounts. Dairy Whole milk and chocolate milk. Sour cream. Cream. Ice cream. Cream cheese. Milkshakes. Fats and oils Butter. Margarine. Shortening. Ghee. Beverages Coffee and tea, with or without caffeine. Carbonated beverages. Sodas. Energy drinks. Fruit juice made with acidic fruits, such as orange or grapefruit. Tomato juice. Alcoholic drinks. Sweets and desserts Chocolate and cocoa. Donuts. Seasonings and condiments Pepper. Peppermint and spearmint. Added salt. Any condiments, herbs, or seasonings that cause symptoms. For some people, this may include curry, hot sauce, or vinegar-based salad dressings. The items listed above may not be a complete list of what you should not eat and drink. Contact a food expert for more options. Questions to ask your doctor Diet and lifestyle changes are often the first steps that  are taken to manage symptoms of GERD. If diet and lifestyle changes do not help, talk with your doctor about taking medicines. Where to find more information International Foundation for Gastrointestinal Disorders: aboutgerd.org Summary When you have GERD, food and lifestyle choices are very important in easing your symptoms. Eat small meals often instead of 3 large meals a day. Eat your meals slowly and in a place where you are relaxed. Avoid bending over or lying down until 2-3 hours after eating. Limit high-fat foods such as fatty meats or fried foods. This information is not intended to replace advice given to you by your health care provider. Make sure you discuss any questions you have with your health care provider. Document Revised: 01/14/2020 Document  Reviewed: 01/14/2020 Elsevier Patient Education  2024 Elsevier Inc.  Hiccups  A hiccup is the result of a sudden irritation of a muscle that is used for breathing (diaphragm). The diaphragm is located under your lungs and above your stomach. When the diaphragm gets irritated, it may quickly tighten without your control (have a spasm). The spasm causes you to quickly suck in air, and that causes your vocal cords to close together quickly. These reactions cause the hiccup sound. Hiccups usually last only a short amount of time (less than 48 hours). In unusual cases, they can last for days or months and require you to see your health care provider. Common causes of hiccups include: Eating too fast or eating too much food. Drinking alcohol or bubbly (carbonated) drinks. Eating or drinking hot or spicy foods and drinks. Swallowing extra air when sucking on candy or a straw or when chewing on gum. Feeling nervous, stressed, or excited. Having certain conditions that irritate the diaphragm nerves. Having metabolic or nervous system disorders. Follow these instructions at home: To prevent hiccups or lessen discomfort from hiccups: Eat and chew your food slowly. Eat small meals, and avoid overeating. If you drink alcohol: Limit how much you have to: 0-1 drink a day for women who are not pregnant. 0-2 drinks a day for men. Know how much alcohol is in a drink. In the [place] one drink equals one 12 oz bottle of beer (355 mL), one 5 oz glass of wine (148 mL), or one 1 oz glass of hard liquor (44 mL). Limit your drinking of carbonated or fizzy drinks, such as soda. Avoid eating or drinking hot or spicy foods and drinks. General instructions Watch for any changes in your hiccups. Take over-the-counter and prescription medicines only as told by your health care provider. Contact a health care provider if: Your hiccups last for more than 48 hours. Your hiccups do not improve with treatment. You cannot  sleep or eat because of your hiccups. You have unexpected weight loss because of your hiccups. You have numbness, tingling, or weakness. Get help right away if: You have trouble breathing or swallowing. You have severe pain in your abdomen. These symptoms may represent a serious problem that is an emergency. Do not wait to see if the symptoms will go away. Get medical help right away. Call your local emergency services (911 in the [place] Do not drive yourself to the hospital. Summary A hiccup is the result of a sudden irritation of a muscle that is used for breathing (diaphragm). Hiccups can be caused by many things, including eating too fast. Call your health care provider if your hiccups last for more than 48 hours. This information is not intended to replace advice given to you by your  health care provider. Make sure you discuss any questions you have with your health care provider. Document Revised: 03/07/2020 Document Reviewed: 03/07/2020 Elsevier Patient Education  2024 Elsevier Inc.    If you have been instructed to have an in-person evaluation today at a local Urgent Care facility, please use the link below. It will take you to a list of all of our available [facility], including address, phone number and hours of operation. Please do not delay care.  [facility]  If you or a family member do not have a primary care provider, use the link below to schedule a visit and establish care. When you choose a [facility] primary care physician or advanced practice provider, you gain a long-term partner in health. Find a Primary Care Provider  Learn more about [facility]'s in-office and virtual care options: [facility] Get Care Now

## 2023-06-02 DIAGNOSIS — G4733 Obstructive sleep apnea (adult) (pediatric): Secondary | ICD-10-CM | POA: Diagnosis not present

## 2023-08-23 DIAGNOSIS — R5382 Chronic fatigue, unspecified: Secondary | ICD-10-CM | POA: Diagnosis not present

## 2023-08-23 DIAGNOSIS — R891 Abnormal level of hormones in specimens from other organs, systems and tissues: Secondary | ICD-10-CM | POA: Diagnosis not present

## 2023-08-23 DIAGNOSIS — L658 Other specified nonscarring hair loss: Secondary | ICD-10-CM | POA: Diagnosis not present

## 2024-02-29 DIAGNOSIS — M9904 Segmental and somatic dysfunction of sacral region: Secondary | ICD-10-CM | POA: Diagnosis not present

## 2024-02-29 DIAGNOSIS — M4727 Other spondylosis with radiculopathy, lumbosacral region: Secondary | ICD-10-CM | POA: Diagnosis not present

## 2024-02-29 DIAGNOSIS — M4728 Other spondylosis with radiculopathy, sacral and sacrococcygeal region: Secondary | ICD-10-CM | POA: Diagnosis not present

## 2024-02-29 DIAGNOSIS — M9903 Segmental and somatic dysfunction of lumbar region: Secondary | ICD-10-CM | POA: Diagnosis not present
